# Patient Record
Sex: Female | Born: 1985 | Race: Black or African American | Hispanic: No | Marital: Single | Smoking: Never smoker
Health system: Southern US, Community
[De-identification: ages and names within clinical notes are randomized; demographics above are authoritative.]

## PROBLEM LIST (undated history)

## (undated) DIAGNOSIS — B9689 Other specified bacterial agents as the cause of diseases classified elsewhere: Secondary | ICD-10-CM

## (undated) DIAGNOSIS — E049 Nontoxic goiter, unspecified: Secondary | ICD-10-CM

## (undated) DIAGNOSIS — N390 Urinary tract infection, site not specified: Secondary | ICD-10-CM

## (undated) DIAGNOSIS — N760 Acute vaginitis: Secondary | ICD-10-CM

## (undated) HISTORY — PX: TONSILLECTOMY: SUR1361

## (undated) HISTORY — PX: WISDOM TOOTH EXTRACTION: SHX21

---

## 2009-10-27 ENCOUNTER — Emergency Department (HOSPITAL_BASED_OUTPATIENT_CLINIC_OR_DEPARTMENT_OTHER): Admission: EM | Admit: 2009-10-27 | Discharge: 2009-10-27 | Payer: Self-pay | Admitting: Emergency Medicine

## 2009-12-14 ENCOUNTER — Emergency Department (HOSPITAL_BASED_OUTPATIENT_CLINIC_OR_DEPARTMENT_OTHER): Admission: EM | Admit: 2009-12-14 | Discharge: 2009-12-14 | Payer: Self-pay | Admitting: Emergency Medicine

## 2010-03-20 ENCOUNTER — Emergency Department (HOSPITAL_BASED_OUTPATIENT_CLINIC_OR_DEPARTMENT_OTHER): Admission: EM | Admit: 2010-03-20 | Discharge: 2010-03-20 | Payer: Self-pay | Admitting: Emergency Medicine

## 2010-04-19 ENCOUNTER — Emergency Department (HOSPITAL_BASED_OUTPATIENT_CLINIC_OR_DEPARTMENT_OTHER): Admission: EM | Admit: 2010-04-19 | Discharge: 2010-04-19 | Payer: Self-pay | Admitting: Emergency Medicine

## 2010-10-19 LAB — URINE CULTURE
Colony Count: >=100000
Culture  Setup Time: 201109141736

## 2010-10-19 LAB — WET PREP, GENITAL
Clue Cells Wet Prep HPF POC: NONE SEEN
Trich, Wet Prep: NONE SEEN
WBC, Wet Prep HPF POC: NONE SEEN

## 2010-10-19 LAB — URINALYSIS, ROUTINE W REFLEX MICROSCOPIC
Ketones, ur: NEGATIVE mg/dL
Nitrite: NEGATIVE
Protein, ur: NEGATIVE mg/dL
Urobilinogen, UA: 0.2 mg/dL (ref 0.0–1.0)
pH: 5.5 (ref 5.0–8.0)

## 2010-10-20 LAB — URINALYSIS, ROUTINE W REFLEX MICROSCOPIC
Ketones, ur: NEGATIVE mg/dL
Protein, ur: NEGATIVE mg/dL
Urobilinogen, UA: 0.2 mg/dL (ref 0.0–1.0)

## 2010-10-20 LAB — URINE MICROSCOPIC-ADD ON

## 2010-10-30 LAB — URINALYSIS, ROUTINE W REFLEX MICROSCOPIC
Bilirubin Urine: NEGATIVE
Glucose, UA: NEGATIVE mg/dL
Ketones, ur: NEGATIVE mg/dL
Nitrite: NEGATIVE
Specific Gravity, Urine: 1.015 (ref 1.005–1.030)
pH: 6 (ref 5.0–8.0)

## 2010-10-30 LAB — URINE MICROSCOPIC-ADD ON

## 2010-10-30 LAB — WET PREP, GENITAL

## 2010-10-30 LAB — RPR: RPR Ser Ql: NONREACTIVE

## 2011-09-10 ENCOUNTER — Emergency Department (HOSPITAL_BASED_OUTPATIENT_CLINIC_OR_DEPARTMENT_OTHER)
Admission: EM | Admit: 2011-09-10 | Discharge: 2011-09-10 | Disposition: A | Discharge status: Home / Self Care | Payer: Self-pay | Attending: Emergency Medicine | Admitting: Emergency Medicine

## 2011-09-10 ENCOUNTER — Encounter (HOSPITAL_BASED_OUTPATIENT_CLINIC_OR_DEPARTMENT_OTHER): Payer: Self-pay

## 2011-09-10 DIAGNOSIS — N898 Other specified noninflammatory disorders of vagina: Secondary | ICD-10-CM | POA: Insufficient documentation

## 2011-09-10 DIAGNOSIS — N939 Abnormal uterine and vaginal bleeding, unspecified: Secondary | ICD-10-CM

## 2011-09-10 DIAGNOSIS — R110 Nausea: Secondary | ICD-10-CM | POA: Insufficient documentation

## 2011-09-10 LAB — URINALYSIS, MICROSCOPIC ONLY
Bilirubin Urine: NEGATIVE
Leukocytes, UA: NEGATIVE
Nitrite: NEGATIVE
Specific Gravity, Urine: 1.015 (ref 1.005–1.030)
Urobilinogen, UA: 0.2 mg/dL (ref 0.0–1.0)
WBC, UA: NONE SEEN WBC/hpf (ref <3)
pH: 6 (ref 5.0–8.0)

## 2011-09-10 LAB — WET PREP, GENITAL: Yeast Wet Prep HPF POC: NONE SEEN

## 2011-09-10 MED ORDER — ONDANSETRON 4 MG PO TBDP: 4.0000 mg | ORAL_TABLET | Freq: Three times a day (TID) | ORAL | Status: AC | PRN

## 2011-09-10 MED ORDER — ONDANSETRON 4 MG PO TBDP
4.0000 mg | ORAL_TABLET | Freq: Once | ORAL | Status: AC
  Administered 2011-09-10: 4 mg via ORAL
  Filled 2011-09-10: qty 1

## 2011-09-10 MED ORDER — ETHYNODIOL DIAC-ETH ESTRADIOL 1-35 MG-MCG PO TABS: 1.0000 | ORAL_TABLET | Freq: Every day | ORAL | Status: DC

## 2011-09-10 NOTE — ED Notes (Signed)
Pt reports nausea and vaginal bleeding since Saturday.  She states bleeding is heavy.

## 2011-09-10 NOTE — ED Provider Notes (Signed)
History     CSN: [id number]  Arrival date & time 09/10/11  1008   First MD Initiated Contact with Patient 09/10/11 1104      Chief Complaint  Patient presents with  . Vaginal Bleeding  . Nausea    (Consider location/radiation/quality/duration/timing/severity/associated sxs/prior treatment) HPI  H/o vaginal bleeding x 2 days. States heavy bleeding since this morning. Mild nausea. Has gone through 3 pads in past four hours. C/O diffuse lower abdominal pain and back pain. Was taking depo provera-- last injection October.   Has been spotting since October. Denies fever, chills. Sexually active, does not use protection. States she's unsure if she's pregnant. Denies hematuria/dysuria/freq/urgency. C/O white vaginal discharge for several weeks.  Pmhx: none Pshx: none  No family history on file.  History  Substance Use Topics  . Smoking status: Never Smoker   . Smokeless tobacco: Never Used  . Alcohol Use: No    OB History    Grav Para Term Preterm Abortions TAB SAB Ect Mult Living                  Review of Systems  All other systems reviewed and are negative.   except as noted HPI   Allergies  Review of patient's allergies indicates no known allergies.  Home Medications   Current Outpatient Rx  Name Route Sig Dispense Refill  . ETHYNODIOL DIAC-ETH ESTRADIOL 1-35 MG-MCG PO TABS Oral Take 1 tablet by mouth daily. 1 Package 11    Take 5 pills on day 1, four pills on day 2, three  ...  . ONDANSETRON 4 MG PO TBDP Oral Take 1 tablet (4 mg total) by mouth every 8 (eight) hours as needed for nausea. 20 tablet 0    BP 117/74  Pulse 68  Temp(Src) 97.8 F (36.6 C) (Oral)  Resp 18  Ht 5\' 2"  (1.575 m)  Wt 141 lb (63.957 kg)  BMI 25.79 kg/m2  SpO2 100%  LMP 09/10/2011  Physical Exam  Nursing note and vitals reviewed. Constitutional: She is oriented to person, place, and time. She appears well-developed.  HENT:  Head: Atraumatic.  Mouth/Throat: Oropharynx is clear  and moist.  Eyes: Conjunctivae and EOM are normal. Pupils are equal, round, and reactive to light.  Neck: Normal range of motion. Neck supple.  Cardiovascular: Normal rate, regular rhythm, normal heart sounds and intact distal pulses.   Pulmonary/Chest: Effort normal and breath sounds normal. No respiratory distress. She has no wheezes. She has no rales.  Abdominal: Soft. She exhibits no distension. There is no tenderness. There is no rebound and no guarding.       No CVAT  Genitourinary: No vaginal discharge found.       Blood actively oozing from cervical os pooling in vaginal vault-- moderate No CMT No R/L adnexal ttp  Musculoskeletal: Normal range of motion.  Neurological: She is alert and oriented to person, place, and time.  Skin: Skin is warm and dry. No rash noted.  Psychiatric: She has a normal mood and affect.    ED Course  Procedures (including critical care time)  Labs Reviewed  URINALYSIS, WITH MICROSCOPIC - Abnormal; Notable for the following:    Hgb urine dipstick MODERATE (*)    All other components within normal limits  WET PREP, GENITAL - Abnormal; Notable for the following:    Clue Cells Wet Prep HPF POC FEW (*)    WBC, Wet Prep HPF POC FEW (*)    All other components within normal limits  PREGNANCY, URINE  GC/CHLAMYDIA PROBE AMP, GENITAL   No results found.   1. Vaginal bleeding     MDM  She presents with moderate vaginal bleeding. I think this is likely secondary to her meds Depo-Provera. She does state that she will like to be started on oral contraceptive pills. She is asymptomatic at this time other than abdominal cramping she is hemodynamically stable. We'll start her on high dose oral estrogen to control bleeding as she does have some active bleeding on my exam. She is low risk for thrombosis. The patient should follow with her OB/GYN in the next 2 weeks who can start her on oral birth control pills with refills. Advised re: use of barrier  contraception. Ibuprofen prn cramping.        Forbes Cellar, MD 09/10/11 1239

## 2011-09-11 LAB — GC/CHLAMYDIA PROBE AMP, GENITAL
Chlamydia, DNA Probe: NEGATIVE
GC Probe Amp, Genital: NEGATIVE

## 2011-12-13 ENCOUNTER — Emergency Department (HOSPITAL_BASED_OUTPATIENT_CLINIC_OR_DEPARTMENT_OTHER)
Admission: EM | Admit: 2011-12-13 | Discharge: 2011-12-13 | Disposition: A | Discharge status: Home / Self Care | Payer: Self-pay | Attending: Emergency Medicine | Admitting: Emergency Medicine

## 2011-12-13 ENCOUNTER — Encounter (HOSPITAL_BASED_OUTPATIENT_CLINIC_OR_DEPARTMENT_OTHER): Payer: Self-pay | Admitting: *Deleted

## 2011-12-13 DIAGNOSIS — IMO0002 Reserved for concepts with insufficient information to code with codable children: Secondary | ICD-10-CM

## 2011-12-13 DIAGNOSIS — Z4802 Encounter for removal of sutures: Secondary | ICD-10-CM | POA: Insufficient documentation

## 2011-12-13 NOTE — ED Notes (Signed)
Suture removal to her right wrist. States her laceration was repaired at [facility] a week ago. Well healed. Sutures x 2 noted.

## 2011-12-13 NOTE — ED Provider Notes (Signed)
History     CSN: [id number]  Arrival date & time 12/13/11  1536   First MD Initiated Contact with Patient 12/13/11 1552      Chief Complaint  Patient presents with  . Suture / Staple Removal    (Consider location/radiation/quality/duration/timing/severity/associated sxs/prior treatment) Patient is a 26 y.o. female presenting with suture removal. The history is provided by the patient. No language interpreter was used.  Suture / Staple Removal  The sutures were placed 7 to 10 days ago. Treatments since wound repair include regular soap and water washings. Her temperature was unmeasured prior to arrival. There has been no drainage from the wound. There is no redness present. There is no swelling present. The pain has no pain.    History reviewed. No pertinent past medical history.  Past Surgical History  Procedure Date  . Tonsillectomy     No family history on file.  History  Substance Use Topics  . Smoking status: Never Smoker   . Smokeless tobacco: Never Used  . Alcohol Use: No    OB History    Grav Para Term Preterm Abortions TAB SAB Ect Mult Living                  Review of Systems  Constitutional: Negative.   Respiratory: Negative.   Cardiovascular: Negative.     Allergies  Review of patient's allergies indicates no known allergies.  Home Medications   Current Outpatient Rx  Name Route Sig Dispense Refill  . ETHYNODIOL DIAC-ETH ESTRADIOL 1-35 MG-MCG PO TABS Oral Take 1 tablet by mouth daily. 1 Package 11    Take 5 pills on day 1, four pills on day 2, three  ...    BP 123/88  Pulse 73  Temp(Src) 97.3 F (36.3 C) (Oral)  Resp 20  SpO2 100%  Physical Exam  Nursing note and vitals reviewed. Constitutional: She appears well-developed and well-nourished.  Cardiovascular: Normal rate and regular rhythm.   Pulmonary/Chest: Effort normal and breath sounds normal.  Skin:       Pt has a well healed wound to the right wrist    ED Course  SUTURE  REMOVAL Performed by: Teressa Lower Authorized by: Teressa Lower Consent: Verbal consent obtained. Risks and benefits: risks, benefits and alternatives were discussed Consent given by: patient Patient identity confirmed: verbally with patient Time out: Immediately prior to procedure a "time out" was called to verify the correct patient, procedure, equipment, support staff and site/side marked as required. Body area: upper extremity Location details: right wrist Wound Appearance: clean Sutures Removed: 2 Patient tolerance: Patient tolerated the procedure well with no immediate complications.   (including critical care time)  Labs Reviewed - No data to display No results found.   1. Dressing change/suture removal       MDM  Sutures placed at [facility]:no drainage noted:pt has stitches placed at [facility]:pt states that she is having problems with moving her thumb:will give pt hand follow up         Teressa Lower, NP 12/13/11 1615  Teressa Lower, NP 12/13/11 1622

## 2011-12-13 NOTE — Discharge Instructions (Signed)
Suture Removal You have had your sutures (stitches) removed today. This means your wound has healed well enough to take out your stitches. Be careful to protect the wound area over the next several weeks. An injury this area could cause the cut to split open again. It usually takes 1-2 years for a scar to get its full strength and loose its redness. For wounds that heal slowly, tapes may be applied to reinforce the skin for several days after the stitches are removed. You may allow the sutured area to get wet. Topical antibiotics (antibiotics you put on your skin) are not usually needed at this point. Applying vitamin E oil and aloe vera ointments may help the wound heal faster and stronger. Some scars form extra pigment with exposure to sunlight during the first 6-12 months after repair. This can be prevented by using a sun block (SPF 15-30) on the affected area. Call your doctor if you have any concerns about your injury. Call right away if you have any evidence of wound infection such as increased pain, drainage, redness, or swelling. Document Released: 08/30/2004 Document Revised: 07/12/2011 Document Reviewed: 05/14/2008 [employer] Patient Information 2012 [employer]. 

## 2011-12-13 NOTE — ED Provider Notes (Signed)
Medical screening examination/treatment/procedure(s) were performed by non-physician practitioner and as supervising physician I was immediately available for consultation/collaboration.  Kaleeya Hancock T Sherod Cisse, MD 12/13/11 2212 

## 2012-08-01 ENCOUNTER — Emergency Department (HOSPITAL_BASED_OUTPATIENT_CLINIC_OR_DEPARTMENT_OTHER)
Admission: EM | Admit: 2012-08-01 | Discharge: 2012-08-02 | Disposition: A | Discharge status: Home / Self Care | Attending: Emergency Medicine | Admitting: Emergency Medicine

## 2012-08-01 ENCOUNTER — Emergency Department (HOSPITAL_BASED_OUTPATIENT_CLINIC_OR_DEPARTMENT_OTHER)

## 2012-08-01 ENCOUNTER — Encounter (HOSPITAL_BASED_OUTPATIENT_CLINIC_OR_DEPARTMENT_OTHER): Payer: Self-pay

## 2012-08-01 DIAGNOSIS — Y929 Unspecified place or not applicable: Secondary | ICD-10-CM | POA: Insufficient documentation

## 2012-08-01 DIAGNOSIS — Z79899 Other long term (current) drug therapy: Secondary | ICD-10-CM | POA: Insufficient documentation

## 2012-08-01 DIAGNOSIS — S61209A Unspecified open wound of unspecified finger without damage to nail, initial encounter: Secondary | ICD-10-CM | POA: Insufficient documentation

## 2012-08-01 DIAGNOSIS — W230XXA Caught, crushed, jammed, or pinched between moving objects, initial encounter: Secondary | ICD-10-CM | POA: Insufficient documentation

## 2012-08-01 DIAGNOSIS — Y9383 Activity, rough housing and horseplay: Secondary | ICD-10-CM | POA: Insufficient documentation

## 2012-08-01 DIAGNOSIS — S6990XA Unspecified injury of unspecified wrist, hand and finger(s), initial encounter: Secondary | ICD-10-CM

## 2012-08-01 MED ORDER — LIDOCAINE HCL 2 % IJ SOLN
10.0000 mL | Freq: Once | INTRAMUSCULAR | Status: AC
  Administered 2012-08-01: 200 mg
  Filled 2012-08-01: qty 20

## 2012-08-01 MED ORDER — SODIUM CHLORIDE 0.9 % IV SOLN
Freq: Once | INTRAVENOUS | Status: AC
  Administered 2012-08-01: 23:00:00 via INTRAVENOUS

## 2012-08-01 MED ORDER — OXYCODONE-ACETAMINOPHEN 5-325 MG PO TABS: 2.0000 | ORAL_TABLET | ORAL | Status: DC | PRN

## 2012-08-01 MED ORDER — OXYCODONE-ACETAMINOPHEN 5-325 MG PO TABS
1.0000 | ORAL_TABLET | Freq: Once | ORAL | Status: AC
  Administered 2012-08-01: 1 via ORAL
  Filled 2012-08-01 (×2): qty 1

## 2012-08-01 MED ORDER — CEFAZOLIN SODIUM 1-5 GM-% IV SOLN
1.0000 g | Freq: Once | INTRAVENOUS | Status: AC
  Administered 2012-08-01: 1 g via INTRAVENOUS
  Filled 2012-08-01: qty 50

## 2012-08-01 MED ORDER — IBUPROFEN 800 MG PO TABS
800.0000 mg | ORAL_TABLET | Freq: Once | ORAL | Status: AC
  Administered 2012-08-01: 800 mg via ORAL
  Filled 2012-08-01: qty 1

## 2012-08-01 NOTE — ED Provider Notes (Signed)
History     CSN: [id number]  Arrival date & time 08/01/12  0865   First MD Initiated Contact with Patient 08/01/12 2034      Chief Complaint  Patient presents with  . Finger Injury    (Consider location/radiation/quality/duration/timing/severity/associated sxs/prior treatment) Patient is a 26 y.o. female presenting with skin laceration. The history is provided by the patient and a relative. No language interpreter was used.  Laceration  The incident occurred 3 to 5 hours ago. The laceration is located on the right hand. The laceration is 2 cm in size. The pain is at a severity of 6/10. The pain is moderate. The pain has been constant since onset. Possible foreign bodies include wood. Her tetanus status is UTD.   26 year old female with an injury to her third finger on the right hand. Her and her cousin were playing and he shut a door on her finger. There is a flap injury to the skin. We will get an x-ray. Her tetanus is up-to-date. Bleeding is controlled.   History reviewed. No pertinent past medical history.  Past Surgical History  Procedure Date  . Tonsillectomy     No family history on file.  History  Substance Use Topics  . Smoking status: Never Smoker   . Smokeless tobacco: Never Used  . Alcohol Use: No    OB History    Grav Para Term Preterm Abortions TAB SAB Ect Mult Living                  Review of Systems  Constitutional: Negative.   HENT: Negative.   Eyes: Negative.   Respiratory: Negative.   Cardiovascular: Negative.   Gastrointestinal: Negative.   Skin:       Laceration to 3rd finger on L hand.  Neurological: Negative.   Psychiatric/Behavioral: Negative.   All other systems reviewed and are negative.    Allergies  Review of patient's allergies indicates no known allergies.  Home Medications   Current Outpatient Rx  Name  Route  Sig  Dispense  Refill  . ETHYNODIOL DIAC-ETH ESTRADIOL 1-35 MG-MCG PO TABS   Oral   Take 1 tablet by mouth  daily.   1 Package   11     Take 5 pills on day 1, four pills on day 2, three  ...     BP 132/62  Pulse 78  Temp 98.7 F (37.1 C) (Oral)  Resp 16  Ht 5\' 2"  (1.575 m)  Wt 127 lb (57.607 kg)  BMI 23.23 kg/m2  SpO2 100%  LMP 07/27/2012  Physical Exam  Nursing note and vitals reviewed. Constitutional: She is oriented to person, place, and time. She appears well-developed and well-nourished.  HENT:  Head: Normocephalic and atraumatic.  Eyes: Conjunctivae normal and EOM are normal. Pupils are equal, round, and reactive to light.  Neck: Normal range of motion. Neck supple.  Cardiovascular: Normal rate.   Pulmonary/Chest: Effort normal.  Abdominal: Soft.  Musculoskeletal: Normal range of motion. She exhibits no edema and no tenderness.  Neurological: She is alert and oriented to person, place, and time. She has normal reflexes.  Skin: Skin is warm and dry.       Flap laceration to 3rd finger on L hand tenderness 8/10  Psychiatric: She has a normal mood and affect.    ED Course  NERVE BLOCK Date/Time: 08/01/2012 8:44 AM Performed by: Remi Haggard Authorized by: Remi Haggard Consent: Verbal consent obtained. Risks and benefits: risks, benefits and alternatives were discussed Consent  given by: patient Patient understanding: patient states understanding of the procedure being performed Patient identity confirmed: verbally with patient, arm band, provided demographic data and hospital-assigned identification number Time out: Immediately prior to procedure a "time out" was called to verify the correct patient, procedure, equipment, support staff and site/side marked as required. Indications: pain relief Body area: upper extremity (L 3rd finger) Laterality: left Patient sedated: no Preparation: Patient was prepped and draped in the usual sterile fashion. Anesthetic total: 4 ml Outcome: pain improved Patient tolerance: Patient tolerated the procedure well with no immediate  complications.  Irrigation Date/Time: 08/01/2012 8:46 AM Performed by: Remi Haggard Authorized by: Remi Haggard Consent: Verbal consent obtained. Risks and benefits: risks, benefits and alternatives were discussed Consent given by: patient Patient understanding: patient states understanding of the procedure being performed Patient identity confirmed: verbally with patient, arm band, provided demographic data and hospital-assigned identification number Time out: Immediately prior to procedure a "time out" was called to verify the correct patient, procedure, equipment, support staff and site/side marked as required. Comments: L 3rd finger irrigated with ns 80cc jet lavage with digital block   (including critical care time)  Labs Reviewed - No data to display Dg Finger Middle Right  08/01/2012  *RADIOLOGY REPORT*  Clinical Data: Finger injury  RIGHT MIDDLE FINGER 2+V  Comparison: None.  Findings: There may be a small nondisplaced fracture involving the tuft of the distal phalanx.  A subtle area of linear lucency is noted in this area. No displaced fractures or dislocations identified.  Soft tissue irregularity overlying the distal phalanx is noted.  IMPRESSION:  1.  Cannot rule out small nondisplaced fracture involving the tuft of the distal phalanx.   Original Report Authenticated By: Signa Kell, M.D.      No diagnosis found.    MDM  ? fx to tuft of distal phalanx open.  Irrigated flap wound left open.  Splint applied.  Ancef 1 gram IV in the ER.  Will follow up with hand on Monday.  Ibuprofen / percocet for pain.  She understands to return to ER for severe pain or purulent drainage.       Remi Haggard, NP 08/02/12 1050

## 2012-08-01 NOTE — ED Notes (Addendum)
closed right middle finger in door approx 30 min PTA-slight pooled blood to tip/nail-gauze & tape applied in triage

## 2012-08-01 NOTE — ED Notes (Signed)
NP at bedside.

## 2012-08-01 NOTE — Progress Notes (Signed)
Bacitracin applied to laceration per RN orders. Splint applied with no complications. Patient given instructions on care until she follows up.

## 2012-08-09 NOTE — ED Provider Notes (Signed)
Medical screening examination/treatment/procedure(s) were performed by non-physician practitioner and as supervising physician I was immediately available for consultation/collaboration.   Gwyneth Sprout, MD 08/09/12 1002

## 2013-01-02 ENCOUNTER — Emergency Department (HOSPITAL_BASED_OUTPATIENT_CLINIC_OR_DEPARTMENT_OTHER)
Admission: EM | Admit: 2013-01-02 | Discharge: 2013-01-02 | Disposition: A | Discharge status: Home / Self Care | Attending: Emergency Medicine | Admitting: Emergency Medicine

## 2013-01-02 ENCOUNTER — Encounter (HOSPITAL_BASED_OUTPATIENT_CLINIC_OR_DEPARTMENT_OTHER): Payer: Self-pay | Admitting: *Deleted

## 2013-01-02 DIAGNOSIS — B9689 Other specified bacterial agents as the cause of diseases classified elsewhere: Secondary | ICD-10-CM

## 2013-01-02 DIAGNOSIS — N760 Acute vaginitis: Secondary | ICD-10-CM

## 2013-01-02 DIAGNOSIS — Z3202 Encounter for pregnancy test, result negative: Secondary | ICD-10-CM | POA: Insufficient documentation

## 2013-01-02 LAB — URINALYSIS, ROUTINE W REFLEX MICROSCOPIC
Glucose, UA: NEGATIVE mg/dL
Hgb urine dipstick: NEGATIVE
Protein, ur: NEGATIVE mg/dL
Specific Gravity, Urine: 1.013 (ref 1.005–1.030)
Urobilinogen, UA: 1 mg/dL (ref 0.0–1.0)

## 2013-01-02 LAB — PREGNANCY, URINE: Preg Test, Ur: NEGATIVE

## 2013-01-02 LAB — URINE MICROSCOPIC-ADD ON

## 2013-01-02 MED ORDER — METRONIDAZOLE 500 MG PO TABS: 500.0000 mg | ORAL_TABLET | Freq: Two times a day (BID) | ORAL | Status: DC

## 2013-01-02 MED ORDER — FLUCONAZOLE 150 MG PO TABS: 150.0000 mg | ORAL_TABLET | Freq: Once | ORAL | Status: DC

## 2013-01-02 NOTE — ED Notes (Signed)
Pt amb to triage with quick steady gait in nad. Pt reports vaginal d/c x 3 days, states she looked at it under the microscope at work and thinks it is a yeast infection and or bv.

## 2013-01-02 NOTE — ED Provider Notes (Signed)
History     CSN: [id number]  Arrival date & time 01/02/13  1742   First MD Initiated Contact with Patient 01/02/13 1747      Chief Complaint  Patient presents with  . Vaginal Discharge    (Consider location/radiation/quality/duration/timing/severity/associated sxs/prior treatment) HPI Comments: 27 year old female sent to the emergency department complaining of vaginal discharge x3 days. Discharge is white, thick and has a "fishy odor". 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 Denies abdominal pain, nausea or vomiting. No vaginal bleeding. Sexually active with one partner, uses condoms for protection, states she had a condom stuck inside of her over a period of a few days, she was able to get it out 2 days ago. Denies urinary symptoms, fever, chills, vaginal pain. Last menstrual period began one week ago, ended a couple days ago.  Patient is a 27 y.o. female presenting with vaginal discharge. The history is provided by the patient.  Vaginal Discharge Associated symptoms: no abdominal pain, no dyspareunia, no dysuria, no fever, no nausea and no vomiting     History reviewed. No pertinent past medical history.  Past Surgical History  Procedure Laterality Date  . Tonsillectomy      History reviewed. No pertinent family history.  History  Substance Use Topics  . Smoking status: Never Smoker   . Smokeless tobacco: Never Used  . Alcohol Use: No    OB History   Grav Para Term Preterm Abortions TAB SAB Ect Mult Living                  Review of Systems  Constitutional: Negative for fever and chills.  Gastrointestinal: Negative for nausea, vomiting and abdominal pain.  Genitourinary: Positive for vaginal discharge. Negative for dysuria, urgency, frequency, flank pain, vaginal bleeding, difficulty urinating, menstrual problem, pelvic pain and dyspareunia.  All other  systems reviewed and are negative.    Allergies  Review of patient's allergies indicates no known allergies.  Home Medications   Current Outpatient Rx  Name  Route  Sig  Dispense  Refill  . EXPIRED: ethynodiol-ethinyl estradiol (KELNOR,ZOVIA) 1-35 MG-MCG tablet   Oral   Take 1 tablet by mouth daily.   1 Package   11     Take 5 pills on day 1, four pills on day 2, three  ...   . oxyCODONE-acetaminophen (PERCOCET/ROXICET) 5-325 MG per tablet   Oral   Take 2 tablets by mouth every 4 (four) hours as needed for pain.   15 tablet   0     BP 121/83  Pulse 76  Temp(Src) 98.9 F (37.2 C) (Oral)  Resp 20  SpO2 99%  LMP 12/26/2012  Physical Exam  Nursing note and vitals reviewed. Constitutional: She is oriented to person, place, and time. She appears well-developed and well-nourished. No distress.  HENT:  Head: Normocephalic and atraumatic.  Mouth/Throat: Oropharynx is clear and moist.  Eyes: Conjunctivae are normal.  Neck: Normal range of motion. Neck supple.  Cardiovascular: Normal rate, regular rhythm and normal heart sounds.   Pulmonary/Chest: Effort normal and breath sounds normal.  Abdominal: Soft. Bowel sounds are normal. She exhibits no distension. There is no tenderness.  Genitourinary: Uterus normal. There is no rash or tenderness on the right labia. There is no rash or tenderness on the left labia. Cervix exhibits no motion tenderness, no discharge and no friability. Right adnexum displays no mass,  no tenderness and no fullness. Left adnexum displays no mass, no tenderness and no fullness. No erythema, tenderness or bleeding around the vagina. Vaginal discharge (thick, white, malodorous) found.  Musculoskeletal: Normal range of motion. She exhibits no edema.  Neurological: She is alert and oriented to person, place, and time.  Skin: Skin is warm and dry. She is not diaphoretic.  Psychiatric: She has a normal mood and affect. Her behavior is normal.    ED Course   Procedures (including critical care time)  Labs Reviewed  WET PREP, GENITAL - Abnormal; Notable for the following:    Clue Cells Wet Prep HPF POC MODERATE (*)    WBC, Wet Prep HPF POC FEW (*)    All other components within normal limits  URINALYSIS, ROUTINE W REFLEX MICROSCOPIC - Abnormal; Notable for the following:    Leukocytes, UA TRACE (*)    All other components within normal limits  GC/CHLAMYDIA PROBE AMP  PREGNANCY, URINE  URINE MICROSCOPIC-ADD ON  RPR   No results found.   1. Bacterial vaginosis       MDM  27 year old female with BV. Pelvic exam with white vaginal discharge, otherwise unremarkable. No CMT or adnexal tenderness. She is in no apparent distress with normal vital signs. Will prescribe Flagyl. States she always develops yeast infection with antibiotics, will give her prescription for one Diflucan. Return precautions discussed. Patient states understanding of plan and is agreeable.      Trevor Mace, PA-C 01/02/13 1912

## 2013-01-02 NOTE — ED Provider Notes (Signed)
Medical screening examination/treatment/procedure(s) were performed by non-physician practitioner and as supervising physician I was immediately available for consultation/collaboration.  Geoffery Lyons, MD 01/02/13 2248

## 2013-01-03 LAB — GC/CHLAMYDIA PROBE AMP: CT Probe RNA: NEGATIVE

## 2013-01-27 ENCOUNTER — Encounter (HOSPITAL_BASED_OUTPATIENT_CLINIC_OR_DEPARTMENT_OTHER): Payer: Self-pay | Admitting: *Deleted

## 2013-01-27 ENCOUNTER — Emergency Department (HOSPITAL_BASED_OUTPATIENT_CLINIC_OR_DEPARTMENT_OTHER)
Admission: EM | Admit: 2013-01-27 | Discharge: 2013-01-27 | Disposition: A | Discharge status: Home / Self Care | Attending: Emergency Medicine | Admitting: Emergency Medicine

## 2013-01-27 DIAGNOSIS — Z3202 Encounter for pregnancy test, result negative: Secondary | ICD-10-CM | POA: Insufficient documentation

## 2013-01-27 DIAGNOSIS — N760 Acute vaginitis: Secondary | ICD-10-CM | POA: Insufficient documentation

## 2013-01-27 DIAGNOSIS — A499 Bacterial infection, unspecified: Secondary | ICD-10-CM | POA: Insufficient documentation

## 2013-01-27 DIAGNOSIS — B9689 Other specified bacterial agents as the cause of diseases classified elsewhere: Secondary | ICD-10-CM

## 2013-01-27 LAB — URINALYSIS, ROUTINE W REFLEX MICROSCOPIC
Ketones, ur: NEGATIVE mg/dL
Leukocytes, UA: NEGATIVE
Nitrite: NEGATIVE
Protein, ur: NEGATIVE mg/dL
Urobilinogen, UA: 0.2 mg/dL (ref 0.0–1.0)

## 2013-01-27 LAB — PREGNANCY, URINE: Preg Test, Ur: NEGATIVE

## 2013-01-27 MED ORDER — METRONIDAZOLE 0.75 % VA GEL: 1.0000 | Freq: Two times a day (BID) | VAGINAL | Status: DC

## 2013-01-27 NOTE — ED Provider Notes (Signed)
Medical screening examination/treatment/procedure(s) were performed by non-physician practitioner and as supervising physician I was immediately available for consultation/collaboration.   Joash Tony, MD 01/27/13 2322 

## 2013-01-27 NOTE — ED Provider Notes (Signed)
   History    CSN: [id number] Arrival date & time 01/27/13  1741  First MD Initiated Contact with Patient 01/27/13 1759     Chief Complaint  Patient presents with  . Vaginal Discharge   (Consider location/radiation/quality/duration/timing/severity/associated sxs/prior Treatment) HPI Comments: Pt reports she was here 5/30 with BV.   Pt reports she has had multiple times.   Pt was given rx for flagyl pills.   Pt here requesting rx for metrogel vaginal.  Pt reports symptoms have not changed.  No std risk, not pregnant.    Patient is a 27 y.o. female presenting with vaginal discharge. The history is provided by the patient. No language interpreter was used.  Vaginal Discharge  History reviewed. No pertinent past medical history. Past Surgical History  Procedure Laterality Date  . Tonsillectomy     No family history on file. History  Substance Use Topics  . Smoking status: Never Smoker   . Smokeless tobacco: Never Used  . Alcohol Use: No   OB History   Grav Para Term Preterm Abortions TAB SAB Ect Mult Living                 Review of Systems  Genitourinary: Positive for vaginal discharge.  All other systems reviewed and are negative.    Allergies  Review of patient's allergies indicates no known allergies.  Home Medications   Current Outpatient Rx  Name  Route  Sig  Dispense  Refill  . Norgestimate-Eth Estradiol (SPRINTEC 28 PO)   Oral   Take by mouth.         . EXPIRED: ethynodiol-ethinyl estradiol (KELNOR,ZOVIA) 1-35 MG-MCG tablet   Oral   Take 1 tablet by mouth daily.   1 Package   11     Take 5 pills on day 1, four pills on day 2, three  ...   . fluconazole (DIFLUCAN) 150 MG tablet   Oral   Take 1 tablet (150 mg total) by mouth once.   1 tablet   0   . metroNIDAZOLE (FLAGYL) 500 MG tablet   Oral   Take 1 tablet (500 mg total) by mouth 2 (two) times daily. One po bid x 7 days   14 tablet   0   . oxyCODONE-acetaminophen (PERCOCET/ROXICET) 5-325 MG  per tablet   Oral   Take 2 tablets by mouth every 4 (four) hours as needed for pain.   15 tablet   0    BP 101/69  Pulse 82  Temp(Src) 98 F (36.7 C) (Oral)  Resp 16  Ht 5\' 2"  (1.575 m)  Wt 127 lb (57.607 kg)  BMI 23.22 kg/m2  SpO2 100%  LMP 12/26/2012 Physical Exam  Nursing note and vitals reviewed. Constitutional: She appears well-developed and well-nourished.  HENT:  Head: Normocephalic and atraumatic.  Neck: Normal range of motion.  Cardiovascular: Normal rate.   Pulmonary/Chest: Effort normal.  Abdominal: Soft.  Musculoskeletal: Normal range of motion.  Neurological: She is alert.  Skin: Skin is warm.    ED Course  Procedures (including critical care time) Labs Reviewed  PREGNANCY, URINE  URINALYSIS, ROUTINE W REFLEX MICROSCOPIC   No results found. No diagnosis found.  MDM  metrogel vaginal  Elson Areas, PA-C 01/27/13 1859

## 2013-01-27 NOTE — Discharge Instructions (Signed)
Bacterial Vaginosis Bacterial vaginosis (BV) is a vaginal infection where the normal balance of bacteria in the vagina is disrupted. The normal balance is then replaced by an overgrowth of certain bacteria. There are several different kinds of bacteria that can cause BV. BV is the most common vaginal infection in women of childbearing age. CAUSES   The cause of BV is not fully understood. BV develops when there is an increase or imbalance of harmful bacteria.  Some activities or behaviors can upset the normal balance of bacteria in the vagina and put women at increased risk including:  Having a new sex partner or multiple sex partners.  Douching.  Using an intrauterine device (IUD) for contraception.  It is not clear what role sexual activity plays in the development of BV. However, women that have never had sexual intercourse are rarely infected with BV. Women do not get BV from toilet seats, bedding, swimming pools or from touching objects around them.  SYMPTOMS   Grey vaginal discharge.  A fish-like odor with discharge, especially after sexual intercourse.  Itching or burning of the vagina and vulva.  Burning or pain with urination.  Some women have no signs or symptoms at all. DIAGNOSIS  Your caregiver must examine the vagina for signs of BV. Your caregiver will perform lab tests and look at the sample of vaginal fluid through a microscope. They will look for bacteria and abnormal cells (clue cells), a pH test higher than 4.5, and a positive amine test all associated with BV.  RISKS AND COMPLICATIONS   Pelvic inflammatory disease (PID).  Infections following gynecology surgery.  Developing HIV.  Developing herpes virus. TREATMENT  Sometimes BV will clear up without treatment. However, all women with symptoms of BV should be treated to avoid complications, especially if gynecology surgery is planned. Female partners generally do not need to be treated. However, BV may spread  between female sex partners so treatment is helpful in preventing a recurrence of BV.   BV may be treated with antibiotics. The antibiotics come in either pill or vaginal cream forms. Either can be used with nonpregnant or pregnant women, but the recommended dosages differ. These antibiotics are not harmful to the baby.  BV can recur after treatment. If this happens, a second round of antibiotics will often be prescribed.  Treatment is important for pregnant women. If not treated, BV can cause a premature delivery, especially for a pregnant woman who had a premature birth in the past. All pregnant women who have symptoms of BV should be checked and treated.  For chronic reoccurrence of BV, treatment with a type of prescribed gel vaginally twice a week is helpful. HOME CARE INSTRUCTIONS   Finish all medication as directed by your caregiver.  Do not have sex until treatment is completed.  Tell your sexual partner that you have a vaginal infection. They should see their caregiver and be treated if they have problems, such as a mild rash or itching.  Practice safe sex. Use condoms. Only have 1 sex partner. PREVENTION  Basic prevention steps can help reduce the risk of upsetting the natural balance of bacteria in the vagina and developing BV:  Do not have sexual intercourse (be abstinent).  Do not douche.  Use all of the medicine prescribed for treatment of BV, even if the signs and symptoms go away.  Tell your sex partner if you have BV. That way, they can be treated, if needed, to prevent reoccurrence. SEEK MEDICAL CARE IF:     Your symptoms are not improving after 3 days of treatment.  You have increased discharge, pain, or fever. MAKE SURE YOU:   Understand these instructions.  Will watch your condition.  Will get help right away if you are not doing well or get worse. FOR MORE INFORMATION  Division of STD Prevention (DSTDP), Centers for Disease Control and Prevention:  www.cdc.gov/std American Social Health Association (ASHA): www.ashastd.org  Document Released: 07/23/2005 Document Revised: 10/15/2011 Document Reviewed: 01/13/2009 Exit[employer] Patient Information 2014 [employer].  

## 2013-01-27 NOTE — ED Notes (Signed)
Dx wit bacterial vaginosis several weeks ago - has taken po flagyl without relief- reports gel is usually more effective for her

## 2013-02-07 ENCOUNTER — Encounter (HOSPITAL_BASED_OUTPATIENT_CLINIC_OR_DEPARTMENT_OTHER): Payer: Self-pay | Admitting: *Deleted

## 2013-02-07 ENCOUNTER — Emergency Department (HOSPITAL_BASED_OUTPATIENT_CLINIC_OR_DEPARTMENT_OTHER)
Admission: EM | Admit: 2013-02-07 | Discharge: 2013-02-07 | Disposition: A | Discharge status: Home / Self Care | Payer: Self-pay | Attending: Emergency Medicine | Admitting: Emergency Medicine

## 2013-02-07 DIAGNOSIS — K047 Periapical abscess without sinus: Secondary | ICD-10-CM

## 2013-02-07 DIAGNOSIS — K1370 Unspecified lesions of oral mucosa: Secondary | ICD-10-CM | POA: Insufficient documentation

## 2013-02-07 DIAGNOSIS — J029 Acute pharyngitis, unspecified: Secondary | ICD-10-CM | POA: Insufficient documentation

## 2013-02-07 DIAGNOSIS — K044 Acute apical periodontitis of pulpal origin: Secondary | ICD-10-CM | POA: Insufficient documentation

## 2013-02-07 DIAGNOSIS — Z9889 Other specified postprocedural states: Secondary | ICD-10-CM | POA: Insufficient documentation

## 2013-02-07 MED ORDER — PENICILLIN V POTASSIUM 250 MG PO TABS: 250.0000 mg | ORAL_TABLET | Freq: Four times a day (QID) | ORAL | Status: AC

## 2013-02-07 MED ORDER — OXYCODONE-ACETAMINOPHEN 5-325 MG PO TABS: 1.0000 | ORAL_TABLET | Freq: Four times a day (QID) | ORAL | Status: DC | PRN

## 2013-02-07 NOTE — ED Provider Notes (Signed)
History    CSN: [id number] Arrival date & time 02/07/13  1131  First MD Initiated Contact with Patient 02/07/13 1141     Chief Complaint  Patient presents with  . Otalgia   (Consider location/radiation/quality/duration/timing/severity/associated sxs/prior Treatment) HPI Comments: Patient states for the last one week she developed initially right sided mouth pain which then radiated to the ear with persistent ear pain since.  She denies fever, cough or nasal congestion. No difficulty swallowing. She states she did have a root canal done approximately one month ago but has noticed that her right lower back tooth has been painful as well. She denies any drainage from the ear.  No prior history of similar symptoms  The history is provided by the patient.   History reviewed. No pertinent past medical history. Past Surgical History  Procedure Laterality Date  . Tonsillectomy     No family history on file. History  Substance Use Topics  . Smoking status: Never Smoker   . Smokeless tobacco: Never Used  . Alcohol Use: No   OB History   Grav Para Term Preterm Abortions TAB SAB Ect Mult Living                 Review of Systems  Constitutional: Negative for fever.  HENT: Positive for ear pain, sore throat and mouth sores. Negative for facial swelling, trouble swallowing, neck pain, neck stiffness, voice change and ear discharge.   All other systems reviewed and are negative.    Allergies  Review of patient's allergies indicates no known allergies.  Home Medications   Current Outpatient Rx  Name  Route  Sig  Dispense  Refill  . EXPIRED: ethynodiol-ethinyl estradiol (KELNOR,ZOVIA) 1-35 MG-MCG tablet   Oral   Take 1 tablet by mouth daily.   1 Package   11     Take 5 pills on day 1, four pills on day 2, three  ...   . fluconazole (DIFLUCAN) 150 MG tablet   Oral   Take 1 tablet (150 mg total) by mouth once.   1 tablet   0   . metroNIDAZOLE (FLAGYL) 500 MG tablet   Oral   Take 1 tablet (500 mg total) by mouth 2 (two) times daily. One po bid x 7 days   14 tablet   0   . metroNIDAZOLE (METROGEL VAGINAL) 0.75 % vaginal gel   Vaginal   Place 1 Applicatorful vaginally 2 (two) times daily.   70 g   1   . Norgestimate-Eth Estradiol (SPRINTEC 28 PO)   Oral   Take by mouth.         . oxyCODONE-acetaminophen (PERCOCET/ROXICET) 5-325 MG per tablet   Oral   Take 2 tablets by mouth every 4 (four) hours as needed for pain.   15 tablet   0   . oxyCODONE-acetaminophen (PERCOCET/ROXICET) 5-325 MG per tablet   Oral   Take 1 tablet by mouth every 6 (six) hours as needed for pain.   15 tablet   0   . penicillin v potassium (VEETID) 250 MG tablet   Oral   Take 1 tablet (250 mg total) by mouth 4 (four) times daily.   40 tablet   0    BP 113/70  Pulse 79  Temp(Src) 98.4 F (36.9 C) (Oral)  Resp 16  Ht 5\' 2"  (1.575 m)  Wt 128 lb (58.06 kg)  BMI 23.41 kg/m2  SpO2 99% Physical Exam  Nursing note and vitals reviewed. Constitutional: She is  oriented to person, place, and time. She appears well-developed and well-nourished. No distress.  HENT:  Head: Normocephalic and atraumatic.  Right Ear: Tympanic membrane and ear canal normal.  Left Ear: Tympanic membrane and ear canal normal.  Mouth/Throat: Oral lesions present.    Eyes: EOM are normal. Pupils are equal, round, and reactive to light.  Cardiovascular: Normal rate.   Pulmonary/Chest: Effort normal.  Lymphadenopathy:    She has cervical adenopathy.  Neurological: She is alert and oriented to person, place, and time.  Skin: Skin is warm and dry. No rash noted. No erythema.  Psychiatric: She has a normal mood and affect. Her behavior is normal.    ED Course  Procedures (including critical care time) Labs Reviewed - No data to display No results found. 1. Lesion of oral mucosa   2. Dental infection     MDM   Patient initially presented for ear pain but upon further evaluation found to have  an oral lesion in the right posterior lower jaw near the ear which is causing referred pain in the cause of her true symptoms. She had a root canal done approximately one month ago to a different tooth but the lower back tooth is also painful and mildly decay. Feel this lesion could be infectious from decayed to versus other. No sign of otitis media on exam. Patient treated with penicillin and pain control. She will follow up with her dentist.  Gwyneth Sprout, MD 02/07/13 [phone]

## 2013-02-07 NOTE — ED Notes (Signed)
Patient states that her right ear has been hurting for a week

## 2013-02-28 ENCOUNTER — Encounter (HOSPITAL_BASED_OUTPATIENT_CLINIC_OR_DEPARTMENT_OTHER): Payer: Self-pay | Admitting: Emergency Medicine

## 2013-02-28 ENCOUNTER — Emergency Department (HOSPITAL_BASED_OUTPATIENT_CLINIC_OR_DEPARTMENT_OTHER): Payer: No Typology Code available for payment source

## 2013-02-28 ENCOUNTER — Emergency Department (HOSPITAL_BASED_OUTPATIENT_CLINIC_OR_DEPARTMENT_OTHER)
Admission: EM | Admit: 2013-02-28 | Discharge: 2013-02-28 | Disposition: A | Discharge status: Home / Self Care | Payer: No Typology Code available for payment source | Attending: Emergency Medicine | Admitting: Emergency Medicine

## 2013-02-28 DIAGNOSIS — S46909A Unspecified injury of unspecified muscle, fascia and tendon at shoulder and upper arm level, unspecified arm, initial encounter: Secondary | ICD-10-CM | POA: Insufficient documentation

## 2013-02-28 DIAGNOSIS — Z79899 Other long term (current) drug therapy: Secondary | ICD-10-CM | POA: Insufficient documentation

## 2013-02-28 DIAGNOSIS — Z862 Personal history of diseases of the blood and blood-forming organs and certain disorders involving the immune mechanism: Secondary | ICD-10-CM | POA: Insufficient documentation

## 2013-02-28 DIAGNOSIS — Z8639 Personal history of other endocrine, nutritional and metabolic disease: Secondary | ICD-10-CM | POA: Insufficient documentation

## 2013-02-28 DIAGNOSIS — S59919A Unspecified injury of unspecified forearm, initial encounter: Secondary | ICD-10-CM | POA: Insufficient documentation

## 2013-02-28 DIAGNOSIS — Y9389 Activity, other specified: Secondary | ICD-10-CM | POA: Insufficient documentation

## 2013-02-28 DIAGNOSIS — S6990XA Unspecified injury of unspecified wrist, hand and finger(s), initial encounter: Secondary | ICD-10-CM | POA: Insufficient documentation

## 2013-02-28 DIAGNOSIS — Y92410 Unspecified street and highway as the place of occurrence of the external cause: Secondary | ICD-10-CM | POA: Insufficient documentation

## 2013-02-28 DIAGNOSIS — S59909A Unspecified injury of unspecified elbow, initial encounter: Secondary | ICD-10-CM | POA: Insufficient documentation

## 2013-02-28 DIAGNOSIS — S4980XA Other specified injuries of shoulder and upper arm, unspecified arm, initial encounter: Secondary | ICD-10-CM | POA: Insufficient documentation

## 2013-02-28 HISTORY — DX: Nontoxic goiter, unspecified: E04.9

## 2013-02-28 MED ORDER — IBUPROFEN 800 MG PO TABS
800.0000 mg | ORAL_TABLET | Freq: Once | ORAL | Status: AC
  Administered 2013-02-28: 800 mg via ORAL
  Filled 2013-02-28: qty 1

## 2013-02-28 MED ORDER — METHOCARBAMOL 500 MG PO TABS: 500.0000 mg | ORAL_TABLET | Freq: Two times a day (BID) | ORAL | Status: DC

## 2013-02-28 MED ORDER — IBUPROFEN 800 MG PO TABS: 800.0000 mg | ORAL_TABLET | Freq: Three times a day (TID) | ORAL | Status: DC
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
## 2013-03-01 NOTE — ED Provider Notes (Signed)
Medical screening examination/treatment/procedure(s) were performed by non-physician practitioner and as supervising physician I was immediately available for consultation/collaboration.   Charles B. Sheldon, MD 03/01/13 1709 

## 2013-05-20 ENCOUNTER — Emergency Department (HOSPITAL_BASED_OUTPATIENT_CLINIC_OR_DEPARTMENT_OTHER)
Admission: EM | Admit: 2013-05-20 | Discharge: 2013-05-20 | Disposition: A | Discharge status: Home / Self Care | Payer: 59 | Attending: Emergency Medicine | Admitting: Emergency Medicine

## 2013-05-20 ENCOUNTER — Encounter (HOSPITAL_BASED_OUTPATIENT_CLINIC_OR_DEPARTMENT_OTHER): Payer: Self-pay | Admitting: Emergency Medicine

## 2013-05-20 DIAGNOSIS — Z3202 Encounter for pregnancy test, result negative: Secondary | ICD-10-CM | POA: Insufficient documentation

## 2013-05-20 DIAGNOSIS — Z862 Personal history of diseases of the blood and blood-forming organs and certain disorders involving the immune mechanism: Secondary | ICD-10-CM | POA: Insufficient documentation

## 2013-05-20 DIAGNOSIS — N390 Urinary tract infection, site not specified: Secondary | ICD-10-CM

## 2013-05-20 DIAGNOSIS — Z8639 Personal history of other endocrine, nutritional and metabolic disease: Secondary | ICD-10-CM | POA: Insufficient documentation

## 2013-05-20 DIAGNOSIS — Z79899 Other long term (current) drug therapy: Secondary | ICD-10-CM | POA: Insufficient documentation

## 2013-05-20 LAB — URINE MICROSCOPIC-ADD ON

## 2013-05-20 LAB — URINALYSIS, ROUTINE W REFLEX MICROSCOPIC
Bilirubin Urine: NEGATIVE
Ketones, ur: NEGATIVE mg/dL
Nitrite: NEGATIVE
Protein, ur: NEGATIVE mg/dL
Urobilinogen, UA: 0.2 mg/dL (ref 0.0–1.0)

## 2013-05-20 MED ORDER — CEPHALEXIN 500 MG PO CAPS: 500.0000 mg | ORAL_CAPSULE | Freq: Four times a day (QID) | ORAL | Status: DC

## 2013-05-20 NOTE — ED Provider Notes (Signed)
CSN: [id number]     Arrival date & time 05/20/13  1507 History   First MD Initiated Contact with Patient 05/20/13 1519     Chief Complaint  Patient presents with  . Urinary Tract Infection   (Consider location/radiation/quality/duration/timing/severity/associated sxs/prior Treatment) HPI Comments: Patient is a 27 year old female who presents with a 3 week history of left flank pain. The pain is located in her left flank and does not radiate. The pain is described as aching and moderate. The pain started gradually and progressively worsened since the onset. No alleviating/aggravating factors. The patient has tried nothing for symptoms without relief. Associated symptoms include dysuria and urinary urgency. Patient denies fever, headache, NVD, chest pain, SOB, constipation, abnormal vaginal bleeding/discharge.     Patient is a 27 y.o. female presenting with urinary tract infection.  Urinary Tract Infection    Past Medical History  Diagnosis Date  . Goiter    Past Surgical History  Procedure Laterality Date  . Tonsillectomy     History reviewed. No pertinent family history. History  Substance Use Topics  . Smoking status: Never Smoker   . Smokeless tobacco: Never Used  . Alcohol Use: No   OB History   Grav Para Term Preterm Abortions TAB SAB Ect Mult Living                 Review of Systems  Genitourinary: Positive for dysuria and urgency.  All other systems reviewed and are negative.    Allergies  Review of patient's allergies indicates no known allergies.  Home Medications   Current Outpatient Rx  Name  Route  Sig  Dispense  Refill  . EXPIRED: ethynodiol-ethinyl estradiol (KELNOR,ZOVIA) 1-35 MG-MCG tablet   Oral   Take 1 tablet by mouth daily.   1 Package   11     Take 5 pills on day 1, four pills on day 2, three  ...   . Norgestimate-Eth Estradiol (SPRINTEC 28 PO)   Oral   Take by mouth.          BP 114/65  Pulse 66  Temp(Src) 98.6 F (37 C) (Oral)   Resp 18  Ht 5\' 2"  (1.575 m)  Wt 125 lb (56.7 kg)  BMI 22.86 kg/m2  SpO2 100% Physical Exam  Nursing note and vitals reviewed. Constitutional: She is oriented to person, place, and time. She appears well-developed and well-nourished. No distress.  HENT:  Head: Normocephalic and atraumatic.  Eyes: Conjunctivae and EOM are normal.  Neck: Normal range of motion.  Cardiovascular: Normal rate and regular rhythm.  Exam reveals no gallop and no friction rub.   No murmur heard. Pulmonary/Chest: Effort normal and breath sounds normal. She has no wheezes. She has no rales. She exhibits no tenderness.  Abdominal: Soft. She exhibits no distension. There is no tenderness. There is no rebound and no guarding.  Genitourinary:  No CVA tenderness.   Musculoskeletal: Normal range of motion.  Neurological: She is alert and oriented to person, place, and time. Coordination normal.  Speech is goal-oriented. Moves limbs without ataxia.   Skin: Skin is warm and dry.  Psychiatric: She has a normal mood and affect. Her behavior is normal.    ED Course  Procedures (including critical care time) Labs Review Labs Reviewed  URINALYSIS, ROUTINE W REFLEX MICROSCOPIC - Abnormal; Notable for the following:    APPearance CLOUDY (*)    Leukocytes, UA MODERATE (*)    All other components within normal limits  URINE MICROSCOPIC-ADD ON -  Abnormal; Notable for the following:    Bacteria, UA MANY (*)    All other components within normal limits  URINE CULTURE  PREGNANCY, URINE   Imaging Review No results found.  EKG Interpretation   None       MDM   1. UTI (urinary tract infection)     3:49 PM Urinalysis shows UTI. Patient will have Keflex. Vitals stable and patient afebrile. No further evaluation needed at this time. Patient instructed to return with worsening or concerning symptoms.    Emilia Beck, PA-C 05/20/13 1556

## 2013-05-20 NOTE — ED Provider Notes (Signed)
Medical screening examination/treatment/procedure(s) were performed by non-physician practitioner and as supervising physician I was immediately available for consultation/collaboration.   Kaedyn Belardo B. Foy Vanduyne, MD 05/20/13 2230 

## 2013-05-20 NOTE — ED Notes (Signed)
Pt reports (L) sided flank pain, dysuria, urgency x 3 weeks.

## 2013-05-22 LAB — URINE CULTURE: Colony Count: >=100000

## 2013-05-24 ENCOUNTER — Telehealth (HOSPITAL_COMMUNITY): Payer: Self-pay | Admitting: Emergency Medicine

## 2013-05-24 NOTE — ED Notes (Signed)
Post ED Visit - Positive Culture Follow-up  Culture report reviewed by antimicrobial stewardship pharmacist: []  Wes Dulaney, Pharm.D., BCPS []  Celedonio Miyamoto, Pharm.D., BCPS []  Georgina Pillion, Pharm.D., BCPS []  [name], [address].D., BCPS, AAHIVP []  Estella Husk, Pharm.D., BCPS, AAHIVP [x]  [name], [address].D., BCPS  Positive urine culture Treated with Keflex, organism sensitive to the same and no further patient follow-up is required at this time.  Janice Graham 05/24/2013, 9:42 AM

## 2013-08-24 ENCOUNTER — Emergency Department (HOSPITAL_BASED_OUTPATIENT_CLINIC_OR_DEPARTMENT_OTHER)
Admission: EM | Admit: 2013-08-24 | Discharge: 2013-08-24 | Disposition: A | Discharge status: Home / Self Care | Payer: 59 | Attending: Emergency Medicine | Admitting: Emergency Medicine

## 2013-08-24 ENCOUNTER — Encounter (HOSPITAL_BASED_OUTPATIENT_CLINIC_OR_DEPARTMENT_OTHER): Payer: Self-pay | Admitting: Emergency Medicine

## 2013-08-24 DIAGNOSIS — B9689 Other specified bacterial agents as the cause of diseases classified elsewhere: Secondary | ICD-10-CM | POA: Insufficient documentation

## 2013-08-24 DIAGNOSIS — Z862 Personal history of diseases of the blood and blood-forming organs and certain disorders involving the immune mechanism: Secondary | ICD-10-CM | POA: Insufficient documentation

## 2013-08-24 DIAGNOSIS — N760 Acute vaginitis: Secondary | ICD-10-CM | POA: Insufficient documentation

## 2013-08-24 DIAGNOSIS — N390 Urinary tract infection, site not specified: Secondary | ICD-10-CM

## 2013-08-24 DIAGNOSIS — Z8639 Personal history of other endocrine, nutritional and metabolic disease: Secondary | ICD-10-CM | POA: Insufficient documentation

## 2013-08-24 DIAGNOSIS — B373 Candidiasis of vulva and vagina: Secondary | ICD-10-CM | POA: Insufficient documentation

## 2013-08-24 DIAGNOSIS — B3731 Acute candidiasis of vulva and vagina: Secondary | ICD-10-CM | POA: Insufficient documentation

## 2013-08-24 DIAGNOSIS — Z3202 Encounter for pregnancy test, result negative: Secondary | ICD-10-CM | POA: Insufficient documentation

## 2013-08-24 DIAGNOSIS — A499 Bacterial infection, unspecified: Secondary | ICD-10-CM | POA: Insufficient documentation

## 2013-08-24 DIAGNOSIS — Z79899 Other long term (current) drug therapy: Secondary | ICD-10-CM | POA: Insufficient documentation

## 2013-08-24 LAB — URINALYSIS, ROUTINE W REFLEX MICROSCOPIC
Bilirubin Urine: NEGATIVE
Glucose, UA: NEGATIVE mg/dL
Hgb urine dipstick: NEGATIVE
Ketones, ur: NEGATIVE mg/dL
Nitrite: POSITIVE — AB
PH: 5.5 (ref 5.0–8.0)
Protein, ur: 30 mg/dL — AB
SPECIFIC GRAVITY, URINE: 1.017 (ref 1.005–1.030)
Urobilinogen, UA: 0.2 mg/dL (ref 0.0–1.0)

## 2013-08-24 LAB — WET PREP, GENITAL
TRICH WET PREP: NONE SEEN
Yeast Wet Prep HPF POC: NONE SEEN

## 2013-08-24 LAB — URINE MICROSCOPIC-ADD ON

## 2013-08-24 LAB — PREGNANCY, URINE: Preg Test, Ur: NEGATIVE

## 2013-08-24 MED ORDER — METRONIDAZOLE 500 MG PO TABS: 500.0000 mg | ORAL_TABLET | Freq: Two times a day (BID) | ORAL | Status: DC

## 2013-08-24 MED ORDER — CEPHALEXIN 500 MG PO CAPS: 500.0000 mg | ORAL_CAPSULE | Freq: Three times a day (TID) | ORAL | Status: DC

## 2013-08-24 MED ORDER — FLUCONAZOLE 150 MG PO TABS: 150.0000 mg | ORAL_TABLET | Freq: Once | ORAL | Status: AC

## 2013-08-24 MED ORDER — METRONIDAZOLE 0.75 % VA GEL: 1.0000 | Freq: Two times a day (BID) | VAGINAL | Status: DC

## 2013-08-24 MED ORDER — FLUCONAZOLE 100 MG PO TABS
150.0000 mg | ORAL_TABLET | Freq: Once | ORAL | Status: AC
  Administered 2013-08-24: 150 mg via ORAL
  Filled 2013-08-24 (×2): qty 1

## 2013-08-24 MED ORDER — CEPHALEXIN 250 MG PO CAPS
500.0000 mg | ORAL_CAPSULE | Freq: Once | ORAL | Status: AC
  Administered 2013-08-24: 500 mg via ORAL
  Filled 2013-08-24: qty 2

## 2013-08-24 NOTE — ED Notes (Signed)
In to discharge pt and while giving instructions, pt states she cannot take Flagyl pills requesting vaginal cream insert - notified EDP. Order changed. Prescription given for Flagyl cream.

## 2013-08-24 NOTE — ED Provider Notes (Signed)
CSN: [id number]     Arrival date & time 08/24/13  1645 History   First MD Initiated Contact with Patient 08/24/13 1746     Chief Complaint  Patient presents with  . Dysuria   (Consider location/radiation/quality/duration/timing/severity/associated sxs/prior Treatment) HPI Janice Graham is a 28 y.o. female who presents emergency department complaining of dysuria, urinary frequency, vaginal discharge, vaginal discomfort. Patient states her UTI symptoms started a week ago. She states she had 3 amoxicillin left over she took him with no relief. Patient states that her vaginal discharge and irritation began 3 days ago. She states her perineum is itchy, there is white thick vaginal discharge. She is sexually active and uses protection. She denies any fever, chills, nausea, vomiting, back pain, abdominal pain. She states her symptoms are similar to prior UTI and prior vaginal yeast infection. She did not try any over-the-counter medications.  Past Medical History  Diagnosis Date  . Goiter    Past Surgical History  Procedure Laterality Date  . Tonsillectomy     No family history on file. History  Substance Use Topics  . Smoking status: Never Smoker   . Smokeless tobacco: Never Used  . Alcohol Use: No   OB History   Grav Para Term Preterm Abortions TAB SAB Ect Mult Living                 Review of Systems  Constitutional: Negative for fever and chills.  Respiratory: Negative for cough, chest tightness and shortness of breath.   Cardiovascular: Negative for chest pain, palpitations and leg swelling.  Gastrointestinal: Negative for nausea, vomiting, abdominal pain and diarrhea.  Genitourinary: Positive for dysuria, urgency, frequency, vaginal discharge and vaginal pain. Negative for hematuria, flank pain, vaginal bleeding and pelvic pain.  Musculoskeletal: Negative for arthralgias, myalgias, neck pain and neck stiffness.  Skin: Negative for rash.  Neurological: Negative for dizziness,  weakness and headaches.  All other systems reviewed and are negative.    Allergies  Review of patient's allergies indicates no known allergies.  Home Medications   Current Outpatient Rx  Name  Route  Sig  Dispense  Refill  . cephALEXin (KEFLEX) 500 MG capsule   Oral   Take 1 capsule (500 mg total) by mouth 4 (four) times daily.   40 capsule   0   . EXPIRED: ethynodiol-ethinyl estradiol (KELNOR,ZOVIA) 1-35 MG-MCG tablet   Oral   Take 1 tablet by mouth daily.   1 Package   11     Take 5 pills on day 1, four pills on day 2, three  ...   . Norgestimate-Eth Estradiol (SPRINTEC 28 PO)   Oral   Take by mouth.          BP 116/71  Pulse 63  Temp(Src) 98.3 F (36.8 C) (Oral)  Resp 20  Ht 5\' 2"  (1.575 m)  Wt 125 lb (56.7 kg)  BMI 22.86 kg/m2  SpO2 100%  LMP 08/03/2013 Physical Exam  Nursing note and vitals reviewed. Constitutional: She appears well-developed and well-nourished. No distress.  HENT:  Head: Normocephalic.  Eyes: Conjunctivae are normal.  Neck: Neck supple.  Cardiovascular: Normal rate, regular rhythm and normal heart sounds.   Pulmonary/Chest: Effort normal and breath sounds normal. No respiratory distress. She has no wheezes. She has no rales.  Abdominal: Soft. Bowel sounds are normal. She exhibits no distension. There is no tenderness. There is no rebound.  Genitourinary:  Erythema round bilateral labia majora around vaginal opening. Vaginal canal irritated with thick white  vaginal discharge. Cervix normal. No CMT, no adnexal tenderness.   Musculoskeletal: She exhibits no edema.  Neurological: She is alert.  Skin: Skin is warm and dry.  Psychiatric: She has a normal mood and affect. Her behavior is normal.    ED Course  Procedures (including critical care time) Labs Review Labs Reviewed  WET PREP, GENITAL - Abnormal; Notable for the following:    Clue Cells Wet Prep HPF POC MODERATE (*)    WBC, Wet Prep HPF POC MODERATE (*)    All other  components within normal limits  URINALYSIS, ROUTINE W REFLEX MICROSCOPIC - Abnormal; Notable for the following:    APPearance CLOUDY (*)    Protein, ur 30 (*)    Nitrite POSITIVE (*)    Leukocytes, UA SMALL (*)    All other components within normal limits  URINE MICROSCOPIC-ADD ON - Abnormal; Notable for the following:    Bacteria, UA MANY (*)    All other components within normal limits  URINE CULTURE  GC/CHLAMYDIA PROBE AMP  PREGNANCY, URINE   Imaging Review No results found.  EKG Interpretation   None       MDM   1. UTI (lower urinary tract infection)   2. Bacterial vaginitis   3. Vaginal candidiasis     Patient with urinary symptoms or vaginal discharge. Her urine does appear to be infected. Will start on Keflex. External genitalia exam is consistent with Candida infection. I treated her with Diflucan 150 mg by mouth in emergency department. This her wet prep showed bacterial vaginosis. I do not think patient has an STD or cervicitis based on the examination. Will send home with treatment for BV with Flagyl. Have also prescribed her 2 additional Diflucan to take after she finishes antibiotics. Instructed to followup with her Dr.  Ceasar MonsFiled Vitals:   08/24/13 1657  BP: 116/71  Pulse: 63  Temp: 98.3 F (36.8 C)  TempSrc: Oral  Resp: 20  Height: 5\' 2"  (1.575 m)  Weight: 125 lb (56.7 kg)  SpO2: 100%       Lottie Musselatyana A Rikita Grabert, PA-C 08/24/13 1929

## 2013-08-24 NOTE — ED Notes (Signed)
Dysuria 2 weeks. Recent UTI. States she may have a yeast infection.

## 2013-08-24 NOTE — Discharge Instructions (Signed)
Take keflex for your bladder infection until all gone. Take flagyl for your bacterial vaginosis infection until all gone. Take diflucan after finish antibiotics. You can also try nystatin cream over the counter for irritation over external vaginal area. Follow up with your doctor.   Urinary Tract Infection Urinary tract infections (UTIs) can develop anywhere along your urinary tract. Your urinary tract is your body's drainage system for removing wastes and extra water. Your urinary tract includes two kidneys, two ureters, a bladder, and a urethra. Your kidneys are a pair of bean-shaped organs. Each kidney is about the size of your fist. They are located below your ribs, one on each side of your spine. CAUSES Infections are caused by microbes, which are microscopic organisms, including fungi, viruses, and bacteria. These organisms are so small that they can only be seen through a microscope. Bacteria are the microbes that most commonly cause UTIs. SYMPTOMS  Symptoms of UTIs may vary by age and gender of the patient and by the location of the infection. Symptoms in young women typically include a frequent and intense urge to urinate and a painful, burning feeling in the bladder or urethra during urination. Older women and men are more likely to be tired, shaky, and weak and have muscle aches and abdominal pain. A fever may mean the infection is in your kidneys. Other symptoms of a kidney infection include pain in your back or sides below the ribs, nausea, and vomiting. DIAGNOSIS To diagnose a UTI, your caregiver will ask you about your symptoms. Your caregiver also will ask to provide a urine sample. The urine sample will be tested for bacteria and white blood cells. White blood cells are made by your body to help fight infection. TREATMENT  Typically, UTIs can be treated with medication. Because most UTIs are caused by a bacterial infection, they usually can be treated with the use of antibiotics. The  choice of antibiotic and length of treatment depend on your symptoms and the type of bacteria causing your infection. HOME CARE INSTRUCTIONS  If you were prescribed antibiotics, take them exactly as your caregiver instructs you. Finish the medication even if you feel better after you have only taken some of the medication.  Drink enough water and fluids to keep your urine clear or pale yellow.  Avoid caffeine, tea, and carbonated beverages. They tend to irritate your bladder.  Empty your bladder often. Avoid holding urine for long periods of time.  Empty your bladder before and after sexual intercourse.  After a bowel movement, women should cleanse from front to back. Use each tissue only once. SEEK MEDICAL CARE IF:   You have back pain.  You develop a fever.  Your symptoms do not begin to resolve within 3 days. SEEK IMMEDIATE MEDICAL CARE IF:   You have severe back pain or lower abdominal pain.  You develop chills.  You have nausea or vomiting.  You have continued burning or discomfort with urination. MAKE SURE YOU:   Understand these instructions.  Will watch your condition.  Will get help right away if you are not doing well or get worse. Document Released: 05/02/2005 Document Revised: 01/22/2012 Document Reviewed: 08/31/2011 [employer] Patient Information 2014 [place], [place]. Bacterial Vaginosis Bacterial vaginosis is a vaginal infection that occurs when the normal balance of bacteria in the vagina is disrupted. It results from an overgrowth of certain bacteria. This is the most common vaginal infection in women of childbearing age. Treatment is important to prevent complications, especially in pregnant  women, as it can cause a premature delivery. CAUSES  Bacterial vaginosis is caused by an increase in harmful bacteria that are normally present in smaller amounts in the vagina. Several different kinds of bacteria can cause bacterial vaginosis. However, the reason that the  condition develops is not fully understood. RISK FACTORS Certain activities or behaviors can put you at an increased risk of developing bacterial vaginosis, including:  Having a new sex partner or multiple sex partners.  Douching.  Using an intrauterine device (IUD) for contraception. Women do not get bacterial vaginosis from toilet seats, bedding, swimming pools, or contact with objects around them. SIGNS AND SYMPTOMS  Some women with bacterial vaginosis have no signs or symptoms. Common symptoms include:  Grey vaginal discharge.  A fishlike odor with discharge, especially after sexual intercourse.  Itching or burning of the vagina and vulva.  Burning or pain with urination. DIAGNOSIS  Your health care provider will take a medical history and examine the vagina for signs of bacterial vaginosis. A sample of vaginal fluid may be taken. Your health care provider will look at this sample under a microscope to check for bacteria and abnormal cells. A vaginal pH test may also be done.  TREATMENT  Bacterial vaginosis may be treated with antibiotic medicines. These may be given in the form of a pill or a vaginal cream. A second round of antibiotics may be prescribed if the condition comes back after treatment.  HOME CARE INSTRUCTIONS   Only take over-the-counter or prescription medicines as directed by your health care provider.  If antibiotic medicine was prescribed, take it as directed. Make sure you finish it even if you start to feel better.  Do not have sex until treatment is completed.  Tell all sexual partners that you have a vaginal infection. They should see their health care provider and be treated if they have problems, such as a mild rash or itching.  Practice safe sex by using condoms and only having one sex partner. SEEK MEDICAL CARE IF:   Your symptoms are not improving after 3 days of treatment.  You have increased discharge or pain.  You have a fever. MAKE SURE  YOU:   Understand these instructions.  Will watch your condition.  Will get help right away if you are not doing well or get worse. FOR MORE INFORMATION  Centers for Disease Control and Prevention, Division of STD Prevention: SolutionApps.co.zawww.cdc.gov/std American Sexual Health Association (ASHA): www.ashastd.org  Document Released: 07/23/2005 Document Revised: 05/13/2013 Document Reviewed: 03/04/2013 [facility] Patient Information 2014 [place], [place].

## 2013-08-24 NOTE — ED Provider Notes (Signed)
Medical screening examination/treatment/procedure(s) were performed by non-physician practitioner and as supervising physician I was immediately available for consultation/collaboration.  Eidan Muellner E Patsey Pitstick, MD 08/24/13 2324 

## 2013-08-25 LAB — GC/CHLAMYDIA PROBE AMP
CT PROBE, AMP APTIMA: NEGATIVE
GC PROBE AMP APTIMA: NEGATIVE

## 2013-08-26 LAB — URINE CULTURE: Colony Count: >=100000

## 2013-10-14 ENCOUNTER — Encounter (HOSPITAL_BASED_OUTPATIENT_CLINIC_OR_DEPARTMENT_OTHER): Payer: Self-pay | Admitting: Emergency Medicine

## 2013-10-14 ENCOUNTER — Emergency Department (HOSPITAL_BASED_OUTPATIENT_CLINIC_OR_DEPARTMENT_OTHER)
Admission: EM | Admit: 2013-10-14 | Discharge: 2013-10-14 | Disposition: A | Discharge status: Home / Self Care | Payer: Managed Care, Other (non HMO) | Attending: Emergency Medicine | Admitting: Emergency Medicine

## 2013-10-14 DIAGNOSIS — K089 Disorder of teeth and supporting structures, unspecified: Secondary | ICD-10-CM | POA: Insufficient documentation

## 2013-10-14 DIAGNOSIS — Z792 Long term (current) use of antibiotics: Secondary | ICD-10-CM | POA: Insufficient documentation

## 2013-10-14 DIAGNOSIS — K0889 Other specified disorders of teeth and supporting structures: Secondary | ICD-10-CM

## 2013-10-14 DIAGNOSIS — Z8639 Personal history of other endocrine, nutritional and metabolic disease: Secondary | ICD-10-CM | POA: Insufficient documentation

## 2013-10-14 DIAGNOSIS — Z862 Personal history of diseases of the blood and blood-forming organs and certain disorders involving the immune mechanism: Secondary | ICD-10-CM | POA: Insufficient documentation

## 2013-10-14 DIAGNOSIS — Z79899 Other long term (current) drug therapy: Secondary | ICD-10-CM | POA: Insufficient documentation

## 2013-10-14 MED ORDER — HYDROCODONE-ACETAMINOPHEN 5-325 MG PO TABS: 2.0000 | ORAL_TABLET | ORAL | Status: DC | PRN

## 2013-10-14 MED ORDER — PENICILLIN V POTASSIUM 500 MG PO TABS: 500.0000 mg | ORAL_TABLET | Freq: Four times a day (QID) | ORAL | Status: AC

## 2013-10-14 NOTE — ED Provider Notes (Signed)
CSN: [id number]     Arrival date & time 10/14/13  1514 History   First MD Initiated Contact with Patient 10/14/13 1605     Chief Complaint  Patient presents with  . Dental Pain     (Consider location/radiation/quality/duration/timing/severity/associated sxs/prior Treatment) HPI Comments: Patient presents with dental pain. She states she's had problems with her back upper and lower tooth for about last 6 months. She states it's been worse over the last 2 weeks. She denies any fevers or vomiting. She has not feel a followup with a dentist because she doesn't have insurance. She denies any facial swelling or drainage from the teeth.  Patient is a 28 y.o. female presenting with tooth pain.  Dental Pain Associated symptoms: no fever, no headaches and no neck pain     Past Medical History  Diagnosis Date  . Goiter    Past Surgical History  Procedure Laterality Date  . Tonsillectomy    . Wisdom tooth extraction     No family history on file. History  Substance Use Topics  . Smoking status: Never Smoker   . Smokeless tobacco: Never Used  . Alcohol Use: No   OB History   Grav Para Term Preterm Abortions TAB SAB Ect Mult Living                 Review of Systems  Constitutional: Negative for fever.  HENT: Positive for dental problem.   Gastrointestinal: Negative for nausea and vomiting.  Musculoskeletal: Negative for neck pain.  Skin: Negative for wound.  Neurological: Negative for weakness, numbness and headaches.      Allergies  Review of patient's allergies indicates no known allergies.  Home Medications   Current Outpatient Rx  Name  Route  Sig  Dispense  Refill  . cephALEXin (KEFLEX) 500 MG capsule   Oral   Take 1 capsule (500 mg total) by mouth 4 (four) times daily.   40 capsule   0   . cephALEXin (KEFLEX) 500 MG capsule   Oral   Take 1 capsule (500 mg total) by mouth 3 (three) times daily.   21 capsule   0   . EXPIRED: ethynodiol-ethinyl estradiol  (KELNOR,ZOVIA) 1-35 MG-MCG tablet   Oral   Take 1 tablet by mouth daily.   1 Package   11     Take 5 pills on day 1, four pills on day 2, three  ...   . HYDROcodone-acetaminophen (NORCO/VICODIN) 5-325 MG per tablet   Oral   Take 2 tablets by mouth every 4 (four) hours as needed.   15 tablet   0   . metroNIDAZOLE (FLAGYL) 500 MG tablet   Oral   Take 1 tablet (500 mg total) by mouth 2 (two) times daily.   14 tablet   0   . metroNIDAZOLE (METROGEL VAGINAL) 0.75 % vaginal gel   Vaginal   Place 1 Applicatorful vaginally 2 (two) times daily.   70 g   0   . Norgestimate-Eth Estradiol (SPRINTEC 28 PO)   Oral   Take by mouth.         . penicillin v potassium (VEETID) 500 MG tablet   Oral   Take 1 tablet (500 mg total) by mouth 4 (four) times daily.   40 tablet   0    BP 125/89  Pulse 72  Temp(Src) 98.7 F (37.1 C) (Oral)  Resp 16  Ht 5\' 2"  (1.575 m)  Wt 135 lb (61.236 kg)  BMI 24.69 kg/m2  SpO2 100%  LMP 09/30/2013 Physical Exam  Constitutional: She is oriented to person, place, and time. She appears well-developed and well-nourished.  HENT:  Head: Normocephalic and atraumatic.  Patient has some pain to the left side upper and lower back molars. There is no induration or fluctuance. No facial swelling. No trismus.  Neck: Normal range of motion. Neck supple.  Cardiovascular: Normal rate.   Pulmonary/Chest: Effort normal.  Neurological: She is alert and oriented to person, place, and time.  Skin: Skin is warm and dry.  Psychiatric: She has a normal mood and affect.    ED Course  Procedures (including critical care time) Labs Review Labs Reviewed - No data to display Imaging Review No results found.   EKG Interpretation None      MDM   Final diagnoses:  Pain, dental    Patient is given prescription for penicillin and Vicodin. She was encouraged to followup with a dentist as soon as possible. She was given a referral to an outpatient  dentist.    Rolan Bucco, MD 10/14/13 [phone]

## 2013-10-14 NOTE — ED Notes (Signed)
C/o left side dental pain for months, worse x 2 weeks

## 2013-10-14 NOTE — Discharge Instructions (Signed)

## 2014-03-18 ENCOUNTER — Emergency Department (HOSPITAL_BASED_OUTPATIENT_CLINIC_OR_DEPARTMENT_OTHER): Payer: Managed Care, Other (non HMO)

## 2014-03-18 ENCOUNTER — Emergency Department (HOSPITAL_BASED_OUTPATIENT_CLINIC_OR_DEPARTMENT_OTHER)
Admission: EM | Admit: 2014-03-18 | Discharge: 2014-03-18 | Disposition: A | Discharge status: Home / Self Care | Payer: Self-pay | Attending: Emergency Medicine | Admitting: Emergency Medicine

## 2014-03-18 ENCOUNTER — Encounter (HOSPITAL_BASED_OUTPATIENT_CLINIC_OR_DEPARTMENT_OTHER): Payer: Self-pay | Admitting: Emergency Medicine

## 2014-03-18 DIAGNOSIS — N760 Acute vaginitis: Secondary | ICD-10-CM | POA: Insufficient documentation

## 2014-03-18 DIAGNOSIS — Z3202 Encounter for pregnancy test, result negative: Secondary | ICD-10-CM | POA: Insufficient documentation

## 2014-03-18 DIAGNOSIS — Z8639 Personal history of other endocrine, nutritional and metabolic disease: Secondary | ICD-10-CM | POA: Insufficient documentation

## 2014-03-18 DIAGNOSIS — R1012 Left upper quadrant pain: Secondary | ICD-10-CM | POA: Insufficient documentation

## 2014-03-18 DIAGNOSIS — Z862 Personal history of diseases of the blood and blood-forming organs and certain disorders involving the immune mechanism: Secondary | ICD-10-CM | POA: Insufficient documentation

## 2014-03-18 DIAGNOSIS — N390 Urinary tract infection, site not specified: Secondary | ICD-10-CM | POA: Insufficient documentation

## 2014-03-18 DIAGNOSIS — A499 Bacterial infection, unspecified: Secondary | ICD-10-CM | POA: Insufficient documentation

## 2014-03-18 DIAGNOSIS — Z79899 Other long term (current) drug therapy: Secondary | ICD-10-CM | POA: Insufficient documentation

## 2014-03-18 DIAGNOSIS — B9689 Other specified bacterial agents as the cause of diseases classified elsewhere: Secondary | ICD-10-CM | POA: Insufficient documentation

## 2014-03-18 LAB — COMPREHENSIVE METABOLIC PANEL
ALBUMIN: 3.9 g/dL (ref 3.5–5.2)
ALT: 12 U/L (ref 0–35)
ANION GAP: 9 (ref 5–15)
AST: 18 U/L (ref 0–37)
Alkaline Phosphatase: 52 U/L (ref 39–117)
BILIRUBIN TOTAL: 0.2 mg/dL — AB (ref 0.3–1.2)
BUN: 10 mg/dL (ref 6–23)
CHLORIDE: 103 meq/L (ref 96–112)
CO2: 29 mEq/L (ref 19–32)
CREATININE: 0.8 mg/dL (ref 0.50–1.10)
Calcium: 9.7 mg/dL (ref 8.4–10.5)
GFR calc non Af Amer: >90 mL/min (ref >90)
GLUCOSE: 91 mg/dL (ref 70–99)
Potassium: 4.1 mEq/L (ref 3.7–5.3)
Sodium: 141 mEq/L (ref 137–147)
Total Protein: 7.1 g/dL (ref 6.0–8.3)

## 2014-03-18 LAB — CBC
HEMATOCRIT: 34.1 % — AB (ref 36.0–46.0)
Hemoglobin: 11.8 g/dL — ABNORMAL LOW (ref 12.0–15.0)
MCH: 28.2 pg (ref 26.0–34.0)
MCHC: 34.6 g/dL (ref 30.0–36.0)
MCV: 81.6 fL (ref 78.0–100.0)
Platelets: 267 10*3/uL (ref 150–400)
RBC: 4.18 MIL/uL (ref 3.87–5.11)
RDW: 12.6 % (ref 11.5–15.5)
WBC: 4.6 10*3/uL (ref 4.0–10.5)

## 2014-03-18 LAB — PREGNANCY, URINE: Preg Test, Ur: NEGATIVE

## 2014-03-18 LAB — URINALYSIS, ROUTINE W REFLEX MICROSCOPIC
BILIRUBIN URINE: NEGATIVE
Glucose, UA: NEGATIVE mg/dL
Ketones, ur: NEGATIVE mg/dL
Nitrite: NEGATIVE
PROTEIN: NEGATIVE mg/dL
SPECIFIC GRAVITY, URINE: 1.017 (ref 1.005–1.030)
Urobilinogen, UA: 1 mg/dL (ref 0.0–1.0)
pH: 6.5 (ref 5.0–8.0)

## 2014-03-18 LAB — URINE MICROSCOPIC-ADD ON

## 2014-03-18 LAB — WET PREP, GENITAL
Trich, Wet Prep: NONE SEEN
Yeast Wet Prep HPF POC: NONE SEEN

## 2014-03-18 LAB — LIPASE, BLOOD: LIPASE: 27 U/L (ref 11–59)

## 2014-03-18 MED ORDER — MORPHINE SULFATE 4 MG/ML IJ SOLN
4.0000 mg | Freq: Once | INTRAMUSCULAR | Status: AC
  Administered 2014-03-18: 4 mg via INTRAVENOUS
  Filled 2014-03-18: qty 1

## 2014-03-18 MED ORDER — CEPHALEXIN 500 MG PO CAPS: 500.0000 mg | ORAL_CAPSULE | Freq: Four times a day (QID) | ORAL | Status: DC

## 2014-03-18 MED ORDER — HYDROCODONE-ACETAMINOPHEN 5-325 MG PO TABS: 1.0000 | ORAL_TABLET | Freq: Four times a day (QID) | ORAL | Status: DC | PRN

## 2014-03-18 MED ORDER — CEFTRIAXONE SODIUM 1 G IJ SOLR
1.0000 g | Freq: Once | INTRAMUSCULAR | Status: AC
  Administered 2014-03-18: 1 g via INTRAVENOUS

## 2014-03-18 MED ORDER — CEFTRIAXONE SODIUM 1 G IJ SOLR
INTRAMUSCULAR | Status: AC
  Filled 2014-03-18: qty 10

## 2014-03-18 MED ORDER — METRONIDAZOLE 0.75 % VA GEL: 1.0000 | Freq: Two times a day (BID) | VAGINAL | Status: DC

## 2014-03-18 MED ORDER — IOHEXOL 300 MG/ML  SOLN
50.0000 mL | Freq: Once | INTRAMUSCULAR | Status: AC | PRN
  Administered 2014-03-18: 50 mL via ORAL

## 2014-03-18 MED ORDER — SODIUM CHLORIDE 0.9 % IV BOLUS (SEPSIS)
1000.0000 mL | Freq: Once | INTRAVENOUS | Status: AC
  Administered 2014-03-18: 1000 mL via INTRAVENOUS

## 2014-03-18 MED ORDER — IOHEXOL 300 MG/ML  SOLN
100.0000 mL | Freq: Once | INTRAMUSCULAR | Status: AC | PRN
  Administered 2014-03-18: 100 mL via INTRAVENOUS

## 2014-03-18 NOTE — ED Provider Notes (Signed)
CSN: [id number]     Arrival date & time 03/18/14  1436 History   First MD Initiated Contact with Patient 03/18/14 1443     Chief Complaint  Patient presents with  . Abdominal Pain     (Consider location/radiation/quality/duration/timing/severity/associated sxs/prior Treatment) Patient is a 28 y.o. female presenting with abdominal pain. The history is provided by the patient.  Abdominal Pain Pain location:  LUQ and LLQ Pain quality: aching and sharp   Pain radiates to:  L leg Pain severity:  Moderate Onset quality:  Gradual Timing:  Constant Progression:  Worsening Chronicity:  New Context: not eating and not recent illness   Relieved by:  Nothing Worsened by:  Nothing tried Associated symptoms: no chills, no cough and no fever     Past Medical History  Diagnosis Date  . Goiter    Past Surgical History  Procedure Laterality Date  . Tonsillectomy    . Wisdom tooth extraction     No family history on file. History  Substance Use Topics  . Smoking status: Never Smoker   . Smokeless tobacco: Never Used  . Alcohol Use: No   OB History   Grav Para Term Preterm Abortions TAB SAB Ect Mult Living                 Review of Systems  Constitutional: Negative for fever and chills.  Respiratory: Negative for cough.   Gastrointestinal: Positive for abdominal pain.  All other systems reviewed and are negative.     Allergies  Review of patient's allergies indicates no known allergies.  Home Medications   Prior to Admission medications   Medication Sig Start Date End Date Taking? Authorizing Provider  cephALEXin (KEFLEX) 500 MG capsule Take 1 capsule (500 mg total) by mouth 4 (four) times daily. 05/20/13   Kaitlyn Szekalski, PA-C  cephALEXin (KEFLEX) 500 MG capsule Take 1 capsule (500 mg total) by mouth 3 (three) times daily. 08/24/13   Tatyana A Kirichenko, PA-C  ethynodiol-ethinyl estradiol (KELNOR,ZOVIA) 1-35 MG-MCG tablet Take 1 tablet by mouth daily. 09/10/11 09/09/12   Forbes CellarLeigh-Ann Webb, MD  HYDROcodone-acetaminophen (NORCO/VICODIN) 5-325 MG per tablet Take 2 tablets by mouth every 4 (four) hours as needed. 10/14/13   Rolan BuccoMelanie Belfi, MD  metroNIDAZOLE (FLAGYL) 500 MG tablet Take 1 tablet (500 mg total) by mouth 2 (two) times daily. 08/24/13   Tatyana A Kirichenko, PA-C  metroNIDAZOLE (METROGEL VAGINAL) 0.75 % vaginal gel Place 1 Applicatorful vaginally 2 (two) times daily. 08/24/13   Tatyana A Kirichenko, PA-C  Norgestimate-Eth Estradiol (SPRINTEC 28 PO) Take by mouth.    Historical Provider, MD   BP 115/72  Pulse 77  Temp(Src) 98.2 F (36.8 C) (Oral)  Resp 16  Ht 5\' 2"  (1.575 m)  Wt 134 lb (60.782 kg)  BMI 24.50 kg/m2  SpO2 99%  LMP 03/18/2014 Physical Exam  Nursing note and vitals reviewed. Constitutional: She is oriented to person, place, and time. She appears well-developed and well-nourished. No distress.  HENT:  Head: Normocephalic and atraumatic.  Eyes: EOM are normal. Pupils are equal, round, and reactive to light.  Neck: Normal range of motion. Neck supple.  Cardiovascular: Normal rate and regular rhythm.  Exam reveals no friction rub.   No murmur heard. Pulmonary/Chest: Effort normal and breath sounds normal. No respiratory distress. She has no wheezes. She has no rales.  Abdominal: Soft. She exhibits no distension. There is tenderness (LLQ, LUQ). There is no rebound.  Genitourinary: Cervix exhibits no motion tenderness and no discharge. Right  adnexum displays no mass and no tenderness. Left adnexum displays no mass and no tenderness.  Musculoskeletal: Normal range of motion. She exhibits no edema.  Neurological: She is alert and oriented to person, place, and time.  Skin: She is not diaphoretic.    ED Course  Procedures (including critical care time) Labs Review Labs Reviewed  WET PREP, GENITAL - Abnormal; Notable for the following:    Clue Cells Wet Prep HPF POC MODERATE (*)    WBC, Wet Prep HPF POC FEW (*)    All other components  within normal limits  CBC - Abnormal; Notable for the following:    Hemoglobin 11.8 (*)    HCT 34.1 (*)    All other components within normal limits  GC/CHLAMYDIA PROBE AMP  PREGNANCY, URINE  URINALYSIS, ROUTINE W REFLEX MICROSCOPIC  COMPREHENSIVE METABOLIC PANEL  LIPASE, BLOOD    Imaging Review Ct Abdomen Pelvis W Contrast  03/18/2014   CLINICAL DATA:  Left upper quadrant pain.  EXAM: CT ABDOMEN AND PELVIS WITH CONTRAST  TECHNIQUE: Multidetector CT imaging of the abdomen and pelvis was performed using the standard protocol following bolus administration of intravenous contrast.  CONTRAST:  50mL OMNIPAQUE IOHEXOL 300 MG/ML SOLN, OMNIPAQUE IOHEXOL 300 MG/ML SOLN  COMPARISON:  None.  FINDINGS: Clear lung bases.  Heart is normal in size.  Normal liver, spleen, gallbladder and pancreas. No bile duct dilation. No adrenal masses.  2 adjacent lower pole right renal cysts, the larger measuring 2 cm. There is mild bilateral renal collecting system dilation. This is likely physiologic related to bladder distention. No ureteral dilation or stone. Kidneys otherwise unremarkable. No bladder mass or wall thickening. No bladder stone.  Normal uterus and adnexa. No adenopathy. No abnormal fluid collections.  Normal bowel.  Portions of the normal appendix are visualized.  No bony abnormality.  IMPRESSION: 1. No convincing acute findings. Mild bilateral renal collecting system dilation is felt physiologic related to bladder distention. No evidence of a ureteral stone or obstruction. 2. Small lower pole right renal cyst. 3. No other abnormalities.   Electronically Signed   By: Amie Portland M.D.   On: 03/18/2014 17:13     EKG Interpretation None      MDM   Final diagnoses:  UTI (lower urinary tract infection)  BV (bacterial vaginosis)    62F here with urinary frequency, L side pain - worsening for past week. LMP now. No vaginal discharge. No vomiting or fevers. On exam, no pelvic tenderness. Moderate  LLQ and LUQ tenderness. Will scan. Scan negative. Urine shows UTI. Rocephin given. Wet prep with positive clue cells, given flagyl and keflex.  Elwin Mocha, MD 03/18/14 [phone]

## 2014-03-18 NOTE — Discharge Instructions (Signed)
Bacterial Vaginosis °Bacterial vaginosis is a vaginal infection that occurs when the normal balance of bacteria in the vagina is disrupted. It results from an overgrowth of certain bacteria. This is the most common vaginal infection in women of childbearing age. Treatment is important to prevent complications, especially in pregnant women, as it can cause a premature delivery. °CAUSES  °Bacterial vaginosis is caused by an increase in harmful bacteria that are normally present in smaller amounts in the vagina. Several different kinds of bacteria can cause bacterial vaginosis. However, the reason that the condition develops is not fully understood. °RISK FACTORS °Certain activities or behaviors can put you at an increased risk of developing bacterial vaginosis, including: °· Having a new sex partner or multiple sex partners. °· Douching. °· Using an intrauterine device (IUD) for contraception. °Women do not get bacterial vaginosis from toilet seats, bedding, swimming pools, or contact with objects around them. °SIGNS AND SYMPTOMS  °Some women with bacterial vaginosis have no signs or symptoms. Common symptoms include: °· Grey vaginal discharge. °· A fishlike odor with discharge, especially after sexual intercourse. °· Itching or burning of the vagina and vulva. °· Burning or pain with urination. °DIAGNOSIS  °Your health care provider will take a medical history and examine the vagina for signs of bacterial vaginosis. A sample of vaginal fluid may be taken. Your health care provider will look at this sample under a microscope to check for bacteria and abnormal cells. A vaginal pH test may also be done.  °TREATMENT  °Bacterial vaginosis may be treated with antibiotic medicines. These may be given in the form of a pill or a vaginal cream. A second round of antibiotics may be prescribed if the condition comes back after treatment.  °HOME CARE INSTRUCTIONS  °· Only take over-the-counter or prescription medicines as  directed by your health care provider. °· If antibiotic medicine was prescribed, take it as directed. Make sure you finish it even if you start to feel better. °· Do not have sex until treatment is completed. °· Tell all sexual partners that you have a vaginal infection. They should see their health care provider and be treated if they have problems, such as a mild rash or itching. °· Practice safe sex by using condoms and only having one sex partner. °SEEK MEDICAL CARE IF:  °· Your symptoms are not improving after 3 days of treatment. °· You have increased discharge or pain. °· You have a fever. °MAKE SURE YOU:  °· Understand these instructions. °· Will watch your condition. °· Will get help right away if you are not doing well or get worse. °FOR MORE INFORMATION  °Centers for Disease Control and Prevention, Division of STD Prevention: www.cdc.gov/std °American Sexual Health Association (ASHA): www.ashastd.org  °Document Released: 07/23/2005 Document Revised: 05/13/2013 Document Reviewed: 03/04/2013 °ExitCare® Patient Information ©2015 [employer]. This information is not intended to replace advice given to you by your health care provider. Make sure you discuss any questions you have with your health care provider. °Urinary Tract Infection °Urinary tract infections (UTIs) can develop anywhere along your urinary tract. Your urinary tract is your body's drainage system for removing wastes and extra water. Your urinary tract includes two kidneys, two ureters, a bladder, and a urethra. Your kidneys are a pair of bean-shaped organs. Each kidney is about the size of your fist. They are located below your ribs, one on each side of your spine. °CAUSES °Infections are caused by microbes, which are microscopic organisms, including fungi, viruses, and   bacteria. These organisms are so small that they can only be seen through a microscope. Bacteria are the microbes that most commonly cause UTIs. °SYMPTOMS  °Symptoms of UTIs  may vary by age and gender of the patient and by the location of the infection. Symptoms in young women typically include a frequent and intense urge to urinate and a painful, burning feeling in the bladder or urethra during urination. Older women and men are more likely to be tired, shaky, and weak and have muscle aches and abdominal pain. A fever may mean the infection is in your kidneys. Other symptoms of a kidney infection include pain in your back or sides below the ribs, nausea, and vomiting. °DIAGNOSIS °To diagnose a UTI, your caregiver will ask you about your symptoms. Your caregiver also will ask to provide a urine sample. The urine sample will be tested for bacteria and white blood cells. White blood cells are made by your body to help fight infection. °TREATMENT  °Typically, UTIs can be treated with medication. Because most UTIs are caused by a bacterial infection, they usually can be treated with the use of antibiotics. The choice of antibiotic and length of treatment depend on your symptoms and the type of bacteria causing your infection. °HOME CARE INSTRUCTIONS °· If you were prescribed antibiotics, take them exactly as your caregiver instructs you. Finish the medication even if you feel better after you have only taken some of the medication. °· Drink enough water and fluids to keep your urine clear or pale yellow. °· Avoid caffeine, tea, and carbonated beverages. They tend to irritate your bladder. °· Empty your bladder often. Avoid holding urine for long periods of time. °· Empty your bladder before and after sexual intercourse. °· After a bowel movement, women should cleanse from front to back. Use each tissue only once. °SEEK MEDICAL CARE IF:  °· You have back pain. °· You develop a fever. °· Your symptoms do not begin to resolve within 3 days. °SEEK IMMEDIATE MEDICAL CARE IF:  °· You have severe back pain or lower abdominal pain. °· You develop chills. °· You have nausea or vomiting. °· You have  continued burning or discomfort with urination. °MAKE SURE YOU:  °· Understand these instructions. °· Will watch your condition. °· Will get help right away if you are not doing well or get worse. °Document Released: 05/02/2005 Document Revised: 01/22/2012 Document Reviewed: 08/31/2011 °ExitCare® Patient Information ©2015 [employer]. This information is not intended to replace advice given to you by your health care provider. Make sure you discuss any questions you have with your health care provider. ° °

## 2014-03-18 NOTE — ED Notes (Signed)
Ct notified that the pt has completed contrast

## 2014-03-18 NOTE — ED Notes (Signed)
Frequency, left side pain, discharge, nausea x 3 weeks.

## 2014-03-19 LAB — GC/CHLAMYDIA PROBE AMP
CT Probe RNA: NEGATIVE
GC Probe RNA: NEGATIVE

## 2014-03-21 LAB — URINE CULTURE
Colony Count: >=100000
Special Requests: NORMAL

## 2014-03-22 ENCOUNTER — Telehealth (HOSPITAL_BASED_OUTPATIENT_CLINIC_OR_DEPARTMENT_OTHER): Payer: Self-pay | Admitting: Emergency Medicine

## 2014-03-22 NOTE — Telephone Encounter (Signed)
Post ED Visit - Positive Culture Follow-up  Culture report reviewed by antimicrobial stewardship pharmacist: []  Wes Dulaney, Pharm.D., BCPS []  Celedonio MiyamotoJeremy Frens, Pharm.D., BCPS []  Georgina PillionElizabeth Martin, Pharm.D., BCPS []  AuburnMinh Pham, [address]Pharm[address]D., BCPS, AAHIVP []  Estella HuskMichelle Turner, Pharm.D., BCPS, AAHIVP [x]  Red ChristiansSamson Lee, Pharm.D. []  Tennis Mustassie Stewart, [place]D.  Positive urine culture >100,000 colonies E. Coli Treated with Cephalexin 500mg  po qid x 7 days, organism sensitive to the same and no further patient follow-up is required at this time. As per pharmacist change antibiotic only if symptoms not resolving  Berle MullMiller, Makiah Foye 03/22/2014, 5:18 PM

## 2014-06-07 ENCOUNTER — Emergency Department (HOSPITAL_BASED_OUTPATIENT_CLINIC_OR_DEPARTMENT_OTHER): Payer: Managed Care, Other (non HMO)

## 2014-06-07 ENCOUNTER — Emergency Department (HOSPITAL_BASED_OUTPATIENT_CLINIC_OR_DEPARTMENT_OTHER)
Admission: EM | Admit: 2014-06-07 | Discharge: 2014-06-07 | Disposition: A | Discharge status: Home / Self Care | Payer: Managed Care, Other (non HMO) | Attending: Emergency Medicine | Admitting: Emergency Medicine

## 2014-06-07 ENCOUNTER — Encounter (HOSPITAL_BASED_OUTPATIENT_CLINIC_OR_DEPARTMENT_OTHER): Payer: Self-pay

## 2014-06-07 DIAGNOSIS — R197 Diarrhea, unspecified: Secondary | ICD-10-CM | POA: Insufficient documentation

## 2014-06-07 DIAGNOSIS — Z3202 Encounter for pregnancy test, result negative: Secondary | ICD-10-CM | POA: Insufficient documentation

## 2014-06-07 DIAGNOSIS — Z87440 Personal history of urinary (tract) infections: Secondary | ICD-10-CM | POA: Insufficient documentation

## 2014-06-07 DIAGNOSIS — N730 Acute parametritis and pelvic cellulitis: Secondary | ICD-10-CM | POA: Insufficient documentation

## 2014-06-07 DIAGNOSIS — Z872 Personal history of diseases of the skin and subcutaneous tissue: Secondary | ICD-10-CM | POA: Insufficient documentation

## 2014-06-07 DIAGNOSIS — Z79899 Other long term (current) drug therapy: Secondary | ICD-10-CM | POA: Insufficient documentation

## 2014-06-07 DIAGNOSIS — Z8639 Personal history of other endocrine, nutritional and metabolic disease: Secondary | ICD-10-CM | POA: Insufficient documentation

## 2014-06-07 HISTORY — DX: Urinary tract infection, site not specified: N39.0

## 2014-06-07 LAB — CBC WITH DIFFERENTIAL/PLATELET
BASOS ABS: 0 10*3/uL (ref 0.0–0.1)
Basophils Relative: 1 % (ref 0–1)
Eosinophils Absolute: 0 10*3/uL (ref 0.0–0.7)
Eosinophils Relative: 1 % (ref 0–5)
HEMATOCRIT: 33.8 % — AB (ref 36.0–46.0)
HEMOGLOBIN: 11.8 g/dL — AB (ref 12.0–15.0)
LYMPHS PCT: 54 % — AB (ref 12–46)
Lymphs Abs: 2.1 10*3/uL (ref 0.7–4.0)
MCH: 28.4 pg (ref 26.0–34.0)
MCHC: 34.9 g/dL (ref 30.0–36.0)
MCV: 81.3 fL (ref 78.0–100.0)
MONO ABS: 0.3 10*3/uL (ref 0.1–1.0)
MONOS PCT: 8 % (ref 3–12)
Neutro Abs: 1.4 10*3/uL — ABNORMAL LOW (ref 1.7–7.7)
Neutrophils Relative %: 36 % — ABNORMAL LOW (ref 43–77)
Platelets: 237 10*3/uL (ref 150–400)
RBC: 4.16 MIL/uL (ref 3.87–5.11)
RDW: 12.3 % (ref 11.5–15.5)
WBC: 3.8 10*3/uL — AB (ref 4.0–10.5)

## 2014-06-07 LAB — COMPREHENSIVE METABOLIC PANEL
ALT: 12 U/L (ref 0–35)
AST: 13 U/L (ref 0–37)
Albumin: 3.9 g/dL (ref 3.5–5.2)
Alkaline Phosphatase: 48 U/L (ref 39–117)
Anion gap: 10 (ref 5–15)
BILIRUBIN TOTAL: 0.4 mg/dL (ref 0.3–1.2)
BUN: 11 mg/dL (ref 6–23)
CHLORIDE: 102 meq/L (ref 96–112)
CO2: 29 mEq/L (ref 19–32)
CREATININE: 0.7 mg/dL (ref 0.50–1.10)
Calcium: 9.4 mg/dL (ref 8.4–10.5)
GFR calc Af Amer: >90 mL/min (ref >90)
GFR calc non Af Amer: >90 mL/min (ref >90)
Glucose, Bld: 88 mg/dL (ref 70–99)
Potassium: 3.6 mEq/L — ABNORMAL LOW (ref 3.7–5.3)
Sodium: 141 mEq/L (ref 137–147)
Total Protein: 6.7 g/dL (ref 6.0–8.3)

## 2014-06-07 LAB — WET PREP, GENITAL
Trich, Wet Prep: NONE SEEN
YEAST WET PREP: NONE SEEN

## 2014-06-07 LAB — URINALYSIS, ROUTINE W REFLEX MICROSCOPIC
Bilirubin Urine: NEGATIVE
GLUCOSE, UA: NEGATIVE mg/dL
HGB URINE DIPSTICK: NEGATIVE
Ketones, ur: NEGATIVE mg/dL
Leukocytes, UA: NEGATIVE
Nitrite: NEGATIVE
Protein, ur: NEGATIVE mg/dL
SPECIFIC GRAVITY, URINE: 1.014 (ref 1.005–1.030)
Urobilinogen, UA: 0.2 mg/dL (ref 0.0–1.0)
pH: 6 (ref 5.0–8.0)

## 2014-06-07 LAB — HIV ANTIBODY (ROUTINE TESTING W REFLEX): HIV 1&2 Ab, 4th Generation: NONREACTIVE

## 2014-06-07 LAB — LIPASE, BLOOD: LIPASE: 19 U/L (ref 11–59)

## 2014-06-07 LAB — PREGNANCY, URINE: PREG TEST UR: NEGATIVE

## 2014-06-07 MED ORDER — AZITHROMYCIN 250 MG PO TABS
1000.0000 mg | ORAL_TABLET | Freq: Once | ORAL | Status: AC
  Administered 2014-06-07: 1000 mg via ORAL
  Filled 2014-06-07: qty 4

## 2014-06-07 MED ORDER — IOHEXOL 300 MG/ML  SOLN
100.0000 mL | Freq: Once | INTRAMUSCULAR | Status: AC | PRN
  Administered 2014-06-07: 100 mL via INTRAVENOUS

## 2014-06-07 MED ORDER — DOXYCYCLINE HYCLATE 100 MG PO CAPS: 100.0000 mg | ORAL_CAPSULE | Freq: Two times a day (BID) | ORAL | Status: DC

## 2014-06-07 MED ORDER — CEFTRIAXONE SODIUM 250 MG IJ SOLR
INTRAMUSCULAR | Status: AC
  Filled 2014-06-07: qty 250

## 2014-06-07 MED ORDER — MORPHINE SULFATE 4 MG/ML IJ SOLN
4.0000 mg | Freq: Once | INTRAMUSCULAR | Status: AC
  Administered 2014-06-07: 4 mg via INTRAVENOUS
  Filled 2014-06-07: qty 1

## 2014-06-07 MED ORDER — CEFTRIAXONE SODIUM 250 MG IJ SOLR
250.0000 mg | Freq: Once | INTRAMUSCULAR | Status: AC
  Administered 2014-06-07: 250 mg via INTRAMUSCULAR
  Filled 2014-06-07: qty 250

## 2014-06-07 MED ORDER — LIDOCAINE HCL (PF) 1 % IJ SOLN
INTRAMUSCULAR | Status: AC
  Filled 2014-06-07: qty 5

## 2014-06-07 MED ORDER — ONDANSETRON HCL 4 MG/2ML IJ SOLN
4.0000 mg | Freq: Once | INTRAMUSCULAR | Status: AC
  Administered 2014-06-07: 4 mg via INTRAVENOUS
  Filled 2014-06-07: qty 2

## 2014-06-07 MED ORDER — AZITHROMYCIN 250 MG PO TABS
ORAL_TABLET | ORAL | Status: DC
Start: 2014-06-07 — End: 2014-06-07
  Filled 2014-06-07: qty 4

## 2014-06-07 NOTE — Discharge Instructions (Signed)
Pelvic Inflammatory Disease °Pelvic inflammatory disease (PID) refers to an infection in some or all of the female organs. The infection can be in the uterus, ovaries, fallopian tubes, or the surrounding tissues in the pelvis. PID can cause abdominal or pelvic pain that comes on suddenly (acute pelvic pain). PID is a serious infection because it can lead to lasting (chronic) pelvic pain or the inability to have children (infertile).  °CAUSES  °The infection is often caused by the normal bacteria found in the vaginal tissues. PID may also be caused by an infection that is spread during sexual contact. PID can also occur following:  °· The birth of a baby.   °· A miscarriage.   °· An abortion.   °· Major pelvic surgery.   °· The use of an intrauterine device (IUD).   °· A sexual assault.   °RISK FACTORS °Certain factors can put a person at higher risk for PID, such as: °· Being younger than 25 years. °· Being sexually active at a young age. °· Using nonbarrier contraception. °· Having multiple sexual partners. °· Having sex with someone who has symptoms of a genital infection. °· Using oral contraception. °Other times, certain behaviors can increase the possibility of getting PID, such as: °· Having sex during your period. °· Using a vaginal douche. °· Having an intrauterine device (IUD) in place. °SYMPTOMS  °· Abdominal or pelvic pain.   °· Fever.   °· Chills.   °· Abnormal vaginal discharge. °· Abnormal uterine bleeding.   °· Unusual pain shortly after finishing your period. °DIAGNOSIS  °Your caregiver will choose some of the following methods to make a diagnosis, such as:  °· Performing a physical exam and history. A pelvic exam typically reveals a very tender uterus and surrounding pelvis.   °· Ordering laboratory tests including a pregnancy test, blood tests, and urine test.  °· Ordering cultures of the vagina and cervix to check for a sexually transmitted infection (STI). °· Performing an ultrasound.    °· Performing a laparoscopic procedure to look inside the pelvis.   °TREATMENT  °· Antibiotic medicines may be prescribed and taken by mouth.   °· Sexual partners may be treated when the infection is caused by a sexually transmitted disease (STD).   °· Hospitalization may be needed to give antibiotics intravenously. °· Surgery may be needed, but this is rare. °It may take weeks until you are completely well. If you are diagnosed with PID, you should also be checked for human immunodeficiency virus (HIV).   °HOME CARE INSTRUCTIONS  °· If given, take your antibiotics as directed. Finish the medicine even if you start to feel better.   °· Only take over-the-counter or prescription medicines for pain, discomfort, or fever as directed by your caregiver.   °· Do not have sexual intercourse until treatment is completed or as directed by your caregiver. If PID is confirmed, your recent sexual partner(s) will need treatment.   °· Keep your follow-up appointments. °SEEK MEDICAL CARE IF:  °· You have increased or abnormal vaginal discharge.   °· You need prescription medicine for your pain.   °· You vomit.   °· You cannot take your medicines.   °· Your partner has an STD.   °SEEK IMMEDIATE MEDICAL CARE IF:  °· You have a fever.   °· You have increased abdominal or pelvic pain.   °· You have chills.   °· You have pain when you urinate.   °· You are not better after 72 hours following treatment.   °MAKE SURE YOU:  °· Understand these instructions. °· Will watch your condition. °· Will get help right away if you are not doing well or get worse. °  Document Released: 07/23/2005 Document Revised: 11/17/2012 Document Reviewed: 07/19/2011 °ExitCare® Patient Information ©2015 [employer]. This information is not intended to replace advice given to you by your health care provider. Make sure you discuss any questions you have with your health care provider. ° °

## 2014-06-07 NOTE — ED Provider Notes (Signed)
CSN: [id number]     Arrival date & time 06/07/14  1111 History   First MD Initiated Contact with Patient 06/07/14 1131     Chief Complaint  Patient presents with  . Pelvic Pain     (Consider location/radiation/quality/duration/timing/severity/associated sxs/prior Treatment) HPI Comments: Pt states that she has had pelvic pain and llq pain intermittent for the last 2 weeks that seems to be worsening. Diarrhea without vomiting. No fever. No more discharge then normal but used flagyl gel for bv type symptoms. No dysuria. No previous abdominal surgery.  The history is provided by the patient. No language interpreter was used.    Past Medical History  Diagnosis Date  . Goiter   . UTI (lower urinary tract infection)    Past Surgical History  Procedure Laterality Date  . Tonsillectomy    . Wisdom tooth extraction     No family history on file. History  Substance Use Topics  . Smoking status: Never Smoker   . Smokeless tobacco: Never Used  . Alcohol Use: No   OB History    No data available     Review of Systems  All other systems reviewed and are negative.     Allergies  Review of patient's allergies indicates no known allergies.  Home Medications   Prior to Admission medications   Medication Sig Start Date End Date Taking? Authorizing Provider  cephALEXin (KEFLEX) 500 MG capsule Take 1 capsule (500 mg total) by mouth 4 (four) times daily. 05/20/13   Kaitlyn Szekalski, PA-C  cephALEXin (KEFLEX) 500 MG capsule Take 1 capsule (500 mg total) by mouth 3 (three) times daily. 08/24/13   Tatyana A Kirichenko, PA-C  cephALEXin (KEFLEX) 500 MG capsule Take 1 capsule (500 mg total) by mouth 4 (four) times daily. 03/18/14   Elwin MochaBlair Walden, MD  ethynodiol-ethinyl estradiol Noni Saupe(KELNOR,ZOVIA) 1-35 MG-MCG tablet Take 1 tablet by mouth daily. 09/10/11 09/09/12  Forbes CellarLeigh-Ann Webb, MD  HYDROcodone-acetaminophen (NORCO/VICODIN) 5-325 MG per tablet Take 2 tablets by mouth every 4 (four) hours as needed.  10/14/13   Rolan BuccoMelanie Belfi, MD  HYDROcodone-acetaminophen (NORCO/VICODIN) 5-325 MG per tablet Take 1 tablet by mouth every 6 (six) hours as needed for moderate pain. 03/18/14   Elwin MochaBlair Walden, MD  metroNIDAZOLE (FLAGYL) 500 MG tablet Take 1 tablet (500 mg total) by mouth 2 (two) times daily. 08/24/13   Tatyana A Kirichenko, PA-C  metroNIDAZOLE (METROGEL VAGINAL) 0.75 % vaginal gel Place 1 Applicatorful vaginally 2 (two) times daily. 08/24/13   Tatyana A Kirichenko, PA-C  metroNIDAZOLE (METROGEL) 0.75 % vaginal gel Place 1 Applicatorful vaginally 2 (two) times daily. 03/18/14   Elwin MochaBlair Walden, MD  Norgestimate-Eth Estradiol (SPRINTEC 28 PO) Take by mouth.    Historical Provider, MD   BP 114/53 mmHg  Pulse 70  Temp(Src) 98.9 F (37.2 C) (Oral)  Resp 16  Ht 5\' 2"  (1.575 m)  Wt 135 lb (61.236 kg)  BMI 24.69 kg/m2  SpO2 99%  LMP 05/30/2014 Physical Exam  Constitutional: She is oriented to person, place, and time. She appears well-developed and well-nourished.  Cardiovascular: Normal rate and regular rhythm.   Pulmonary/Chest: Effort normal and breath sounds normal.  Abdominal: Soft. Bowel sounds are normal. There is tenderness in the left upper quadrant and left lower quadrant.  Genitourinary:  Thick yellow discharge.no cmt  Musculoskeletal: Normal range of motion.  Neurological: She is alert and oriented to person, place, and time. Coordination normal.  Skin: Skin is warm and dry.  Psychiatric: She has a normal mood and  affect.  Nursing note and vitals reviewed.   ED Course  Procedures (including critical care time) Labs Review Labs Reviewed  WET PREP, GENITAL - Abnormal; Notable for the following:    Clue Cells Wet Prep HPF POC FEW (*)    WBC, Wet Prep HPF POC MANY (*)    All other components within normal limits  CBC WITH DIFFERENTIAL - Abnormal; Notable for the following:    WBC 3.8 (*)    Hemoglobin 11.8 (*)    HCT 33.8 (*)    Neutrophils Relative % 36 (*)    Neutro Abs 1.4 (*)     Lymphocytes Relative 54 (*)    All other components within normal limits  COMPREHENSIVE METABOLIC PANEL - Abnormal; Notable for the following:    Potassium 3.6 (*)    All other components within normal limits  GC/CHLAMYDIA PROBE AMP  URINALYSIS, ROUTINE W REFLEX MICROSCOPIC  PREGNANCY, URINE  LIPASE, BLOOD  HIV ANTIBODY (ROUTINE TESTING)    Imaging Review Ct Abdomen Pelvis W Contrast  06/07/2014   CLINICAL DATA:  28 year old female with acute nausea vomiting and pelvic pain. Bilateral lower extremity pain. Initial encounter.  EXAM: CT ABDOMEN AND PELVIS WITH CONTRAST  TECHNIQUE: Multidetector CT imaging of the abdomen and pelvis was performed using the standard protocol following bolus administration of intravenous contrast.  CONTRAST:  100mL OMNIPAQUE IOHEXOL 300 MG/ML  SOLN  COMPARISON:  CT Abdomen and Pelvis 03/18/2014.  FINDINGS: Stable and negative lung bases.  No pericardial or pleural effusion.  No osseous abnormality identified.  No pelvic free fluid. Prominent parametrial soft tissues similar to the prior study. Diminutive bladder. Mildly redundant but otherwise negative distal colon. No inguinal or pelvic lymphadenopathy identified.  Negative left colon. Retained stool in the transverse colon and at both flexures. Negative right colon. Normal appendix seen on series 2, image 56. No dilated small bowel. Decompressed distal small bowel in the pelvis. Oral contrast has not reached the terminal ileum. Stomach is mildly distended with contrast, otherwise within normal limits. Negative duodenum.  Liver, gallbladder, spleen, pancreas, and adrenal glands are within normal limits. No abdominal free fluid. Portal venous system and major arterial structures in the abdomen and pelvis are patent. No abdominal lymphadenopathy identified.  Smaller right renal collecting system. Right lower pole simple appearing renal cyst appears stable. Negative kidneys otherwise.  IMPRESSION: 1. Normal appendix. No focal  inflammation identified in the abdomen. 2. Prominent and somewhat indistinct parametrial soft tissues. Consider pelvic inflammatory disease in this setting.   Electronically Signed   By: Augusto GambleLee  Hall M.D.   On: 06/07/2014 12:47     EKG Interpretation None      MDM   Final diagnoses:  PID (acute pelvic inflammatory disease)    Will treat for pid. Based on ct and exam. [facility]    Teressa LowerVrinda Javarion Douty, NP 06/07/14 1337

## 2014-06-07 NOTE — ED Notes (Signed)
C/o LLQ pain x 1 week-2 days ago pelvic pain started with lower back pain-positive n/v/d last week-"no more than normal" vaginal d/c

## 2014-06-08 LAB — GC/CHLAMYDIA PROBE AMP
CT Probe RNA: NEGATIVE
GC Probe RNA: NEGATIVE

## 2014-11-10 ENCOUNTER — Encounter (HOSPITAL_BASED_OUTPATIENT_CLINIC_OR_DEPARTMENT_OTHER): Payer: Self-pay

## 2014-11-10 ENCOUNTER — Emergency Department (HOSPITAL_BASED_OUTPATIENT_CLINIC_OR_DEPARTMENT_OTHER)
Admission: EM | Admit: 2014-11-10 | Discharge: 2014-11-10 | Disposition: A | Discharge status: Home / Self Care | Payer: Managed Care, Other (non HMO) | Attending: Emergency Medicine | Admitting: Emergency Medicine

## 2014-11-10 DIAGNOSIS — Z8639 Personal history of other endocrine, nutritional and metabolic disease: Secondary | ICD-10-CM | POA: Insufficient documentation

## 2014-11-10 DIAGNOSIS — Z79899 Other long term (current) drug therapy: Secondary | ICD-10-CM | POA: Insufficient documentation

## 2014-11-10 DIAGNOSIS — Z3202 Encounter for pregnancy test, result negative: Secondary | ICD-10-CM | POA: Insufficient documentation

## 2014-11-10 DIAGNOSIS — Z87440 Personal history of urinary (tract) infections: Secondary | ICD-10-CM | POA: Insufficient documentation

## 2014-11-10 DIAGNOSIS — N760 Acute vaginitis: Secondary | ICD-10-CM | POA: Insufficient documentation

## 2014-11-10 DIAGNOSIS — B9689 Other specified bacterial agents as the cause of diseases classified elsewhere: Secondary | ICD-10-CM

## 2014-11-10 HISTORY — DX: Other specified bacterial agents as the cause of diseases classified elsewhere: B96.89

## 2014-11-10 HISTORY — DX: Acute vaginitis: N76.0

## 2014-11-10 LAB — URINALYSIS, ROUTINE W REFLEX MICROSCOPIC
Bilirubin Urine: NEGATIVE
Glucose, UA: NEGATIVE mg/dL
Hgb urine dipstick: NEGATIVE
KETONES UR: NEGATIVE mg/dL
NITRITE: NEGATIVE
PH: 6 (ref 5.0–8.0)
Protein, ur: NEGATIVE mg/dL
SPECIFIC GRAVITY, URINE: 1.013 (ref 1.005–1.030)
Urobilinogen, UA: 0.2 mg/dL (ref 0.0–1.0)

## 2014-11-10 LAB — WET PREP, GENITAL
Trich, Wet Prep: NONE SEEN
YEAST WET PREP: NONE SEEN

## 2014-11-10 LAB — PREGNANCY, URINE: Preg Test, Ur: NEGATIVE

## 2014-11-10 LAB — URINE MICROSCOPIC-ADD ON

## 2014-11-10 MED ORDER — METRONIDAZOLE 500 MG PO TABS: 500.0000 mg | ORAL_TABLET | Freq: Two times a day (BID) | ORAL | Status: DC

## 2014-11-10 MED ORDER — FLUCONAZOLE 50 MG PO TABS
150.0000 mg | ORAL_TABLET | Freq: Once | ORAL | Status: AC
  Administered 2014-11-10: 150 mg via ORAL
  Filled 2014-11-10 (×2): qty 1

## 2014-11-10 NOTE — ED Notes (Signed)
Pa  at bedside. 

## 2014-11-10 NOTE — ED Notes (Signed)
Vaginal d/c x 2 days, dysuria x 2 weeks

## 2014-11-10 NOTE — ED Notes (Signed)
Pelvic cart at bedside. 

## 2014-11-10 NOTE — Discharge Instructions (Signed)
Bacterial Vaginosis Bacterial vaginosis is a vaginal infection that occurs when the normal balance of bacteria in the vagina is disrupted. It results from an overgrowth of certain bacteria. This is the most common vaginal infection in women of childbearing age. Treatment is important to prevent complications, especially in pregnant women, as it can cause a premature delivery. CAUSES  Bacterial vaginosis is caused by an increase in harmful bacteria that are normally present in smaller amounts in the vagina. Several different kinds of bacteria can cause bacterial vaginosis. However, the reason that the condition develops is not fully understood. RISK FACTORS Certain activities or behaviors can put you at an increased risk of developing bacterial vaginosis, including:  Having a new sex partner or multiple sex partners.  Douching.  Using an intrauterine device (IUD) for contraception. Women do not get bacterial vaginosis from toilet seats, bedding, swimming pools, or contact with objects around them. SIGNS AND SYMPTOMS  Some women with bacterial vaginosis have no signs or symptoms. Common symptoms include:  Grey vaginal discharge.  A fishlike odor with discharge, especially after sexual intercourse.  Itching or burning of the vagina and vulva.  Burning or pain with urination. DIAGNOSIS  Your health care provider will take a medical history and examine the vagina for signs of bacterial vaginosis. A sample of vaginal fluid may be taken. Your health care provider will look at this sample under a microscope to check for bacteria and abnormal cells. A vaginal pH test may also be done.  TREATMENT  Bacterial vaginosis may be treated with antibiotic medicines. These may be given in the form of a pill or a vaginal cream. A second round of antibiotics may be prescribed if the condition comes back after treatment.  HOME CARE INSTRUCTIONS   Only take over-the-counter or prescription medicines as  directed by your health care provider.  If antibiotic medicine was prescribed, take it as directed. Make sure you finish it even if you start to feel better.  Do not have sex until treatment is completed.  Tell all sexual partners that you have a vaginal infection. They should see their health care provider and be treated if they have problems, such as a mild rash or itching.  Practice safe sex by using condoms and only having one sex partner. SEEK MEDICAL CARE IF:   Your symptoms are not improving after 3 days of treatment.  You have increased discharge or pain.  You have a fever. MAKE SURE YOU:   Understand these instructions.  Will watch your condition.  Will get help right away if you are not doing well or get worse. FOR MORE INFORMATION  Centers for Disease Control and Prevention, Division of STD Prevention: SolutionApps.co.za American Sexual Health Association (ASHA): www.ashastd.org  Document Released: 07/23/2005 Document Revised: 05/13/2013 Document Reviewed: 03/04/2013 [facility] Patient Information 2015 [place], [place]. This information is not intended to replace advice given to you by your health care provider. Make sure you discuss any questions you have with your health care provider.  Please take your medications as directed. You'll also need follow-up with primary care for further evaluation and management of your symptoms. Please refer to resource guide. Return to ED for new or worsening symptoms.  Emergency Department Resource Guide 1) Find a Doctor and Pay Out of Pocket Although you won't have to find out who is covered by your insurance plan, it is a good idea to ask around and get recommendations. You will then need to call the office and see if  the doctor you have chosen will accept you as a new patient and what types of options they offer for patients who are self-pay. Some doctors offer discounts or will set up payment plans for their patients who do not have  insurance, but you will need to ask so you aren't surprised when you get to your appointment.  2) Contact Your Local Health Department Not all health departments have doctors that can see patients for sick visits, but many do, so it is worth a call to see if yours does. If you don't know where your local health department is, you can check in your phone book. The CDC also has a tool to help you locate your state's health department, and many state websites also have listings of all of their local health departments.  3) Find a Walk-in Clinic If your illness is not likely to be very severe or complicated, you may want to try a walk in clinic. These are popping up all over the country in pharmacies, drugstores, and shopping centers. They're usually staffed by nurse practitioners or physician assistants that have been trained to treat common illnesses and complaints. They're usually fairly quick and inexpensive. However, if you have serious medical issues or chronic medical problems, these are probably not your best option.  No Primary Care Doctor: - Call Health Connect at  [phone] - they can help you locate a primary care doctor that  accepts your insurance, provides certain services, etc. - Physician Referral Service- [phone]  Chronic Pain Problems: Organization         Address  Phone   Notes  [facility]  [phone]) [phone] Patients need to be referred by their primary care doctor.   Medication Assistance: Organization         Address  Phone   Notes  [facility] [address] [place], [place] [address] [phone] --Must be a resident of [place] [facility] -- Must have NO insurance coverage whatsoever (no Medicaid/ Medicare, etc.) -- The pt. MUST have a primary care doctor that directs their care regularly and follows them in the community   MedAssist  [phone]) [phone]   [employer]  [phone]) [phone]    Agencies that provide  inexpensive medical care: Organization         Address  Phone   Notes  [facility]  [phone]   [facility]    [phone]   [facility] [address] [place], [place] [address] [phone]   [facility] [address], [place] [phone]) [phone]   [facility]anned Parenthood    [phone]   [facility]    [phone]) [phone]   [facility]  [address], [place] Phone:  [phone], Fax:  [phone] Hours of Operation:  9 am - 6 pm, M-F.  Also accepts Medicaid/Medicare and self-pay.  [facility]  [address], [place] Phone: [phone]) [phone], Fax: [phone]) [phone]. Hours of Operation:  8:30 am - 5:30 pm, M-F.  Also accepts Medicaid and self-pay.  [facility] [place] [address], [place] Phone: [phone]) [phone]   [facility] [address], [place], [place] [phone], Ext. [phone] Mondays & Thursdays: 7-9 AM.  First 15 patients are seen on a first come, first serve basis.    Medicaid-accepting [facility] Providers:  Organization         Address  Phone   Notes  [facility] [address], [place] [phone] Also accepts self-pay patients.  [facility] [address] Laurell Josephs [place], [place]  [phone]   [facility] [address], [place] [phone]   [facility] [address] [place] [phone]   Renaye Rakers [address], [place]   [phone] Only accepts [place] Access [place] patients after they have their name applied to their card.   Self-Pay (no insurance) in [place]:  Organization         Address  Phone   Notes  Sickle Cell Patients, [facility] [address] [place], [place] [phone]   [facility] [address] [place], [place] [phone]   [facility] [place]  [address], [place] [phone]   [facility]/Dr. Osei-Bonsu  [address] [place] or [address], [place] [phone] Phone number for both [place] and [place] locations is the same.  [facility] [address] [place] [phone]   [facility] [address] [place] or [address] [phone]   [facility] [address] [place] [phone], phone; [phone], fax Sees patients 1st and 3rd Saturday of every month.  Must not qualify for public or private insurance (i.e. Medicaid, Medicare, [place] Health Choice, Veterans' Benefits)  Household income should be no more than 200% of the poverty level The clinic cannot treat you if you are pregnant or think you are pregnant  Sexually transmitted diseases are not treated at the clinic.    Dental Care: Organization         Address  Phone  Notes  [facility] Department of [facility] [address] [place], [place] [phone] Accepts children up to age 36 who are enrolled in [place] or [place] Health Choice; pregnant women with a Medicaid card; and children who have applied for Medicaid or [place] Health Choice, but were declined, whose parents can pay a reduced fee at time of service.  [facility] Department of [facility]  [address], [place] [phone] Accepts children up to age 54 who are enrolled in [place] or [place] Health Choice; pregnant women with a Medicaid card; and children who have applied for Medicaid or [place] Health Choice, but were declined, whose parents can pay a reduced fee at time of service.  [facility]  [address] [place], [place] [phone] Patients are seen by appointment only. Walk-ins are  not accepted. [facility] will see patients 56 years of age and older. Monday - Tuesday (8am-5pm) Most Wednesdays (8:30-5pm) $30 per visit, cash only  [facility] Access PRO[facility]  [address], [facility] [phone] Patients are seen by appointment only. Walk-ins are not accepted. [facility] will see patients 32 years of age and older. One Wednesday Evening (Monthly: Volunteer Based).  $30 per visit, cash only  [employer]  [phone] for adults; Children under age 3, call Graduate Pediatric Dentistry at [phone]. Children aged 10-14, please call [phone] to request a pediatric application.  Dental services  are provided in all areas of dental care including fillings, crowns and bridges, complete and partial dentures, implants, gum treatment, root canals, and extractions. Preventive care is also provided. Treatment is provided to both adults and children. Patients are selected via a lottery and there is often a waiting list.   [facility] [address], [place]  [phone] [email or web address]   [facility] [address] [place], [place] [phone], Ext. [phone] Second and Fourth Thursday of each month, opens at 6:30 AM; Clinic ends at 9 AM.  Patients are seen on a first-come first-served basis, and a limited number are seen during each clinic.   [facility]  [address] Ether Griffins [place], [place] [phone]   Eligibility Requirements You must have lived in [place], [place], or [place] counties for at least the last three months.   You cannot be eligible for state or federal sponsored [place], including [employer], [place], or [employer].   You generally cannot be eligible for healthcare insurance through your employer.    How to apply: Eligibility screenings are held every Tuesday and Wednesday afternoon from 1:00 pm until 4:00 pm. You do not need an appointment for  the interview!  [facility] [address] [place], [place] [phone]   [facility] Health Department  [phone]   [facility] Health Department  [phone]   [facility] Health Department  [phone]    Behavioral Health Resources in the Community: Intensive Outpatient Programs Organization         Address  Phone  Notes  [facility] [address], [place], [place] [phone]   [facility] Outpatient [address] [place], [place] [phone]   [facility] [address], [place], [place]  [phone]   [facility] [address],  [place], [place] [phone] or [phone]   Substance Abuse Resources Organization         Address  Phone  Notes  [facility] Drug Services  [phone]   [facility]  [phone]   [facility]  [phone]   Floydene Flock  [phone]   Residential & Outpatient Substance Abuse Program  [phone]   Psychological Services Organization         Address  Phone  Notes  [facility] Behavioral Health  [phone]   [facility] Services  [phone]   [facility] [address], [place] [phone] or [phone]    Mobile Crisis Teams Organization         Address  Phone  Notes  [facility], Mobile Crisis Care Unit  [phone]   [facility]  [address] [place], [place] [phone]   Doristine Locks [address] [place] [phone]    Self-Help/Support Groups Organization         Address  Phone             Notes  [facility] [place] - variety of support groups  [phone]- [phone] Call for more information  Narcotics Anonymous (NA), [facility] [address], [employer] [place]  2 meetings at this location   Statistician         Address  Phone  Notes  [facility]  [address],    [place]  [phone]   [facility]  [address], [place] [address], [place], [place] [phone]  [place], [place] [phone] Admissions: 8am-3pm M-F  Incentives Substance [place] [address]    [place], [place] [phone]   [facility] [address] [place], [place], [place]   [facility] [address],  [place], [place]   [facility] - Intensive Outpatient [address], [place], [place]   [facility] [address]  [place], [place] [phone] or [phone]   [facility] ([facility]) [address] [place], [place] Accepts Medicaid  [facility] [place] [address],  [place] [phone] Substance Abuse/Addiction Treatment   [facility] Organization         Address  Phone  Notes  [facility]  [phone]   Domenic Schwab, PhD [address] [place], [place]   [phone] or [phone]   [place], [place] [phone]   [facility] [address] [place], [place] [phone] Insurance/Medicaid/sponsorship through [facility] and Families [address]                                    [place], [place] [phone] [place] [address] [place], [place] [phone]    Dr. Adele Schilder  [phone]   [facility] Dept. 1) [address], [place] 2) [place] 3)  [address], [place] [phone]  [phone]   [place] [phone] or [phone] (After Hours)

## 2014-11-10 NOTE — ED Provider Notes (Signed)
CSN: [id number]     Arrival date & time 11/10/14  1300 History   First MD Initiated Contact with Patient 11/10/14 1641     Chief Complaint  Patient presents with  . Vaginal Discharge     (Consider location/radiation/quality/duration/timing/severity/associated sxs/prior Treatment) HPI Janice Graham is a 29 y.o. female With recurrent UTI because in for evaluation of dysuria and vaginal discharge. Patient states approximately one month ago she had unprotected sex with her ex and has started to experience a vaginal discharge with an odor approximately 4 days ago. She reports having been in the past but says this seems a little bit different and wants to get checked out. She denies any fevers, nausea or vomiting, abdominal pain, back pain, dizziness, shortness of breath, headache. She tried taking one leftover pill of metronidazole for her discharge but that did not help. She is also concerned she may have a UTI as she is experiencing dysuria and hesitancy for the past 4 days.  Past Medical History  Diagnosis Date  . Goiter   . UTI (lower urinary tract infection)   . BV (bacterial vaginosis)    Past Surgical History  Procedure Laterality Date  . Tonsillectomy    . Wisdom tooth extraction     No family history on file. History  Substance Use Topics  . Smoking status: Never Smoker   . Smokeless tobacco: Never Used  . Alcohol Use: No   OB History    No data available     Review of Systems A 10 point review of systems was completed and was negative except for pertinent positives and negatives as mentioned in the history of present illness     Allergies  Review of patient's allergies indicates no known allergies.  Home Medications   Prior to Admission medications   Medication Sig Start Date End Date Taking? Authorizing Provider  ethynodiol-ethinyl estradiol (KELNOR,ZOVIA) 1-35 MG-MCG tablet Take 1 tablet by mouth daily. 09/10/11 09/09/12  Forbes Cellar, MD  metroNIDAZOLE (FLAGYL) 500  MG tablet Take 1 tablet (500 mg total) by mouth 2 (two) times daily. 11/10/14   Joycie Peek, PA-C   BP 123/74 mmHg  Pulse 62  Temp(Src) 98.9 F (37.2 C) (Oral)  Resp 16  Ht  (1.575 m)  Wt 132 lb (59.875 kg)  BMI 24.14 kg/m2  SpO2 100%  LMP 10/09/2014 Physical Exam  Constitutional: She is oriented to person, place, and time. She appears well-developed and well-nourished.  HENT:  Head: Normocephalic and atraumatic.  Mouth/Throat: Oropharynx is clear and moist.  Eyes: Conjunctivae are normal. Pupils are equal, round, and reactive to light. Right eye exhibits no discharge. Left eye exhibits no discharge. No scleral icterus.  Neck: Neck supple.  Cardiovascular: Normal rate, regular rhythm and normal heart sounds.   Pulmonary/Chest: Effort normal and breath sounds normal. No respiratory distress. She has no wheezes. She has no rales.  Abdominal: Soft. There is no tenderness.  Genitourinary:  Chaperone was present for the entire genital exam. No lesions or rashes appreciated on vulva. Cervix visualized on speculum exam and appropriate cultures sampled. Scant blood in vaginal vault. Discharge: Curdy white discharge. Upon bi manual exam- No TTP of the adnexa, no cervical motion tenderness. No fullness or masses appreciated. No abnormalities appreciated in structural anatomy.   Musculoskeletal: She exhibits no tenderness.  Neurological: She is alert and oriented to person, place, and time.  Cranial Nerves II-XII grossly intact  Skin: Skin is warm and dry. No rash noted.  Psychiatric: She  has a normal mood and affect.  Nursing note and vitals reviewed.   ED Course  Procedures (including critical care time) Labs Review Labs Reviewed  WET PREP, GENITAL - Abnormal; Notable for the following:    Clue Cells Wet Prep HPF POC TOO NUMEROUS TO COUNT (*)    WBC, Wet Prep HPF POC MODERATE (*)    All other components within normal limits  URINALYSIS, ROUTINE W REFLEX MICROSCOPIC -  Abnormal; Notable for the following:    APPearance CLOUDY (*)    Leukocytes, UA SMALL (*)    All other components within normal limits  URINE MICROSCOPIC-ADD ON - Abnormal; Notable for the following:    Squamous Epithelial / LPF FEW (*)    Bacteria, UA MANY (*)    All other components within normal limits  URINE CULTURE  PREGNANCY, URINE  HIV ANTIBODY (ROUTINE TESTING)  GC/CHLAMYDIA PROBE AMP ([place])    Imaging Review No results found.   EKG Interpretation None     Meds given in ED:  Medications  fluconazole (DIFLUCAN) tablet 150 mg (150 mg Oral Given 11/10/14 1833)    Discharge Medication List as of 11/10/2014  6:16 PM    START taking these medications   Details  metroNIDAZOLE (FLAGYL) 500 MG tablet Take 1 tablet (500 mg total) by mouth 2 (two) times daily., Starting 11/10/2014, Until Discontinued, Print       Filed Vitals:   11/10/14 1311 11/10/14 1826  BP: 114/72 123/74  Pulse: 63 62  Temp: 99.5 F (37.5 C) 98.9 F (37.2 C)  TempSrc: Oral Oral  Resp: 20 16  Height: 5\' 2"  (1.575 m)   Weight: 132 lb (59.875 kg)   SpO2: 100% 100%    MDM  Vitals stable - WNL -afebrile Pt resting comfortably in ED. PE--Patient clinically with yeast infection. Otherwise benign pelvic/abdominal exam. Labwork--multiple clue cells, suggestive of BV. No evidence of UTI. Suspect dirty catch, however will obtain culture.  DDX--Will treat BV with metronidazole.Diflucan in ED. No evidence of other acute or emergent pathology at this time. No evidence of PID, ectopic, torsion.  I discussed all relevant lab findings and imaging results with pt and they verbalized understanding. Discussed f/u with PCP within 48 hrs and return precautions, pt very amenable to plan.  Final diagnoses:  BV (bacterial vaginosis)        Joycie PeekBenjamin Haileyann Staiger, PA-C 11/10/14 [id number]  Glynn OctaveStephen Rancour, MD 11/10/14 [phone]

## 2014-11-10 NOTE — ED Notes (Signed)
Pt requesting flagyl gel and abx for UTI

## 2014-11-11 LAB — GC/CHLAMYDIA PROBE AMP (~~LOC~~) NOT AT ARMC
Chlamydia: NEGATIVE
Neisseria Gonorrhea: NEGATIVE

## 2014-11-12 LAB — HIV ANTIBODY (ROUTINE TESTING W REFLEX): HIV Screen 4th Generation wRfx: NONREACTIVE

## 2014-11-13 LAB — URINE CULTURE
Colony Count: >=100000
SPECIAL REQUESTS: NORMAL

## 2014-11-14 NOTE — Progress Notes (Signed)
ED Antimicrobial Stewardship Positive Culture Follow Up   Janice Graham is an 29 y.o. female who [facility] with a chief complaint of  Chief Complaint  Patient presents with  . Vaginal Discharge    Recent Results (from the past 720 hour(s))  Urine culture     Status: None   Collection Time: 11/10/14  1:00 PM  Result Value Ref Range Status   Specimen Description URINE, CLEAN CATCH  Final   Special Requests Normal  Final   Colony Count   Final    >=100,000 COLONIES/ML Performed at [facility]    Culture   Final    KLEBSIELLA PNEUMONIAE Performed at [facility]    Report Status 11/13/2014 FINAL  Final   Organism ID, Bacteria KLEBSIELLA PNEUMONIAE  Final      Susceptibility   Klebsiella pneumoniae - MIC*    AMPICILLIN RESISTANT      CEFAZOLIN <=4 SENSITIVE Sensitive     CEFTRIAXONE <=1 SENSITIVE Sensitive     CIPROFLOXACIN <=0.25 SENSITIVE Sensitive     GENTAMICIN <=1 SENSITIVE Sensitive     LEVOFLOXACIN <=0.12 SENSITIVE Sensitive     NITROFURANTOIN 64 INTERMEDIATE Intermediate     TOBRAMYCIN <=1 SENSITIVE Sensitive     TRIMETH/SULFA <=20 SENSITIVE Sensitive     PIP/TAZO <=4 SENSITIVE Sensitive     * KLEBSIELLA PNEUMONIAE  Wet prep, genital     Status: Abnormal   Collection Time: 11/10/14  5:40 PM  Result Value Ref Range Status   Yeast Wet Prep HPF POC NONE SEEN NONE SEEN Final   Trich, Wet Prep NONE SEEN NONE SEEN Final   Clue Cells Wet Prep HPF POC TOO NUMEROUS TO COUNT (A) NONE SEEN Final   WBC, Wet Prep HPF POC MODERATE (A) NONE SEEN Final    [x]  Patient discharged originally without antimicrobial agent and treatment is now indicated  New antibiotic prescription: Continue Flagyl as prescribed and add Bactrim DS 1 tab bid x 3 days  ED Provider: Raymon MuttonMarissa Sciacca, PA-C   Janice Graham, Janice Graham 11/14/2014, 2:45 PM Infectious Diseases Pharmacist Phone# [phone]

## 2014-11-15 ENCOUNTER — Telehealth (HOSPITAL_COMMUNITY): Payer: Self-pay | Admitting: *Deleted

## 2014-11-24 ENCOUNTER — Encounter (HOSPITAL_BASED_OUTPATIENT_CLINIC_OR_DEPARTMENT_OTHER): Payer: Self-pay | Admitting: *Deleted

## 2014-11-24 ENCOUNTER — Emergency Department (HOSPITAL_BASED_OUTPATIENT_CLINIC_OR_DEPARTMENT_OTHER)
Admission: EM | Admit: 2014-11-24 | Discharge: 2014-11-24 | Disposition: A | Discharge status: Home / Self Care | Payer: Managed Care, Other (non HMO) | Attending: Emergency Medicine | Admitting: Emergency Medicine

## 2014-11-24 DIAGNOSIS — Z792 Long term (current) use of antibiotics: Secondary | ICD-10-CM | POA: Insufficient documentation

## 2014-11-24 DIAGNOSIS — R5383 Other fatigue: Secondary | ICD-10-CM | POA: Insufficient documentation

## 2014-11-24 DIAGNOSIS — Z8619 Personal history of other infectious and parasitic diseases: Secondary | ICD-10-CM | POA: Insufficient documentation

## 2014-11-24 DIAGNOSIS — Z87440 Personal history of urinary (tract) infections: Secondary | ICD-10-CM | POA: Insufficient documentation

## 2014-11-24 DIAGNOSIS — Z8742 Personal history of other diseases of the female genital tract: Secondary | ICD-10-CM | POA: Insufficient documentation

## 2014-11-24 DIAGNOSIS — M791 Myalgia, unspecified site: Secondary | ICD-10-CM

## 2014-11-24 DIAGNOSIS — Z8639 Personal history of other endocrine, nutritional and metabolic disease: Secondary | ICD-10-CM | POA: Insufficient documentation

## 2014-11-24 DIAGNOSIS — Z793 Long term (current) use of hormonal contraceptives: Secondary | ICD-10-CM | POA: Insufficient documentation

## 2014-11-24 DIAGNOSIS — R6889 Other general symptoms and signs: Secondary | ICD-10-CM

## 2014-11-24 DIAGNOSIS — J029 Acute pharyngitis, unspecified: Secondary | ICD-10-CM

## 2014-11-24 MED ORDER — AMOXICILLIN 500 MG PO CAPS: 500.0000 mg | ORAL_CAPSULE | Freq: Three times a day (TID) | ORAL | Status: DC

## 2014-11-24 MED ORDER — OSELTAMIVIR PHOSPHATE 75 MG PO CAPS: 75.0000 mg | ORAL_CAPSULE | Freq: Two times a day (BID) | ORAL | Status: DC

## 2014-11-24 NOTE — Discharge Instructions (Signed)

## 2014-11-24 NOTE — ED Notes (Signed)
Pt c/o URI symptoms x 2 days 

## 2014-11-24 NOTE — ED Provider Notes (Signed)
CSN: [id number]     Arrival date & time 11/24/14  1940 History   First MD Initiated Contact with Patient 11/24/14 2026     Chief Complaint  Patient presents with  . URI     (Consider location/radiation/quality/duration/timing/severity/associated sxs/prior Treatment) Patient is a 28 y.o. female presenting with pharyngitis. The history is provided by the patient. No language interpreter was used.  Sore Throat This is a new problem. The current episode started yesterday. The problem occurs constantly. The problem has been gradually worsening. Associated symptoms include arthralgias, chills, congestion, fatigue, myalgias and a sore throat. Pertinent negatives include no abdominal pain, anorexia, change in bowel habit, chest pain, coughing, diaphoresis, fever, headaches, joint swelling, nausea, neck pain, numbness, rash, swollen glands, urinary symptoms, vertigo, visual change, vomiting or weakness. The symptoms are aggravated by swallowing. She has tried nothing for the symptoms. The treatment provided no relief.    Past Medical History  Diagnosis Date  . Goiter   . UTI (lower urinary tract infection)   . BV (bacterial vaginosis)    Past Surgical History  Procedure Laterality Date  . Tonsillectomy    . Wisdom tooth extraction     History reviewed. No pertinent family history. History  Substance Use Topics  . Smoking status: Never Smoker   . Smokeless tobacco: Never Used  . Alcohol Use: No   OB History    No data available     Review of Systems  Constitutional: Positive for chills and fatigue. Negative for fever and diaphoresis.  HENT: Positive for congestion and sore throat.   Respiratory: Negative for cough.   Cardiovascular: Negative for chest pain.  Gastrointestinal: Negative for nausea, vomiting, abdominal pain, anorexia and change in bowel habit.  Musculoskeletal: Positive for myalgias and arthralgias. Negative for joint swelling and neck pain.  Skin: Negative for rash.   Neurological: Negative for vertigo, weakness, numbness and headaches.  All other systems reviewed and are negative.     Allergies  Review of patient's allergies indicates no known allergies.  Home Medications   Prior to Admission medications   Medication Sig Start Date End Date Taking? Authorizing Provider  ethynodiol-ethinyl estradiol (KELNOR,ZOVIA) 1-35 MG-MCG tablet Take 1 tablet by mouth daily. 09/10/11 09/09/12  Forbes Cellar, MD  metroNIDAZOLE (FLAGYL) 500 MG tablet Take 1 tablet (500 mg total) by mouth 2 (two) times daily. 11/10/14   Joycie Peek, PA-C   BP 124/78 mmHg  Pulse 98  Temp(Src) 99.2 F (37.3 C) (Oral)  Resp 18  Ht  (1.575 m)  Wt 132 lb (59.875 kg)  BMI 24.14 kg/m2  SpO2 100%  LMP 11/07/2014 Physical Exam  Constitutional: She is oriented to person, place, and time. She appears well-developed and well-nourished. No distress.  HENT:  Head: Normocephalic and atraumatic.  Mouth/Throat: Uvula is midline. Oropharyngeal exudate, posterior oropharyngeal edema and posterior oropharyngeal erythema present. No tonsillar abscesses.  Eyes: Conjunctivae are normal. No scleral icterus.  Neck: Normal range of motion.  Cardiovascular: Normal rate, regular rhythm and normal heart sounds.  Exam reveals no gallop and no friction rub.   No murmur heard. Pulmonary/Chest: Effort normal and breath sounds normal. No respiratory distress.  Abdominal: Soft. Bowel sounds are normal. She exhibits no distension and no mass. There is no tenderness. There is no guarding.  Neurological: She is alert and oriented to person, place, and time.  Skin: Skin is warm and dry. She is not diaphoretic.    ED Course  Procedures (including critical care time) Labs Review  Labs Reviewed - No data to display  Imaging Review No results found.   EKG Interpretation None      MDM   Final diagnoses:  None    Patient with myalgias, sore throat, exposure to influenza last week. Patient  will be given Tamiflu, amoxicillin. Use OTC pain meds such as NyQuil and DayQuil. Cor: With liquid Benadryl for 3 relief. He is safe for discharge at this time    Arthor Captainbigail Tillie Viverette, PA-C 11/25/14 1812  Tilden FossaElizabeth Rees, MD 11/25/14 2249

## 2015-02-27 ENCOUNTER — Encounter (HOSPITAL_BASED_OUTPATIENT_CLINIC_OR_DEPARTMENT_OTHER): Payer: Self-pay | Admitting: *Deleted

## 2015-02-27 ENCOUNTER — Emergency Department (HOSPITAL_BASED_OUTPATIENT_CLINIC_OR_DEPARTMENT_OTHER)
Admission: EM | Admit: 2015-02-27 | Discharge: 2015-02-27 | Disposition: A | Discharge status: Home / Self Care | Payer: Managed Care, Other (non HMO) | Attending: Emergency Medicine | Admitting: Emergency Medicine

## 2015-02-27 DIAGNOSIS — B9689 Other specified bacterial agents as the cause of diseases classified elsewhere: Secondary | ICD-10-CM

## 2015-02-27 DIAGNOSIS — N390 Urinary tract infection, site not specified: Secondary | ICD-10-CM

## 2015-02-27 DIAGNOSIS — Z793 Long term (current) use of hormonal contraceptives: Secondary | ICD-10-CM | POA: Insufficient documentation

## 2015-02-27 DIAGNOSIS — Z3202 Encounter for pregnancy test, result negative: Secondary | ICD-10-CM | POA: Insufficient documentation

## 2015-02-27 DIAGNOSIS — N760 Acute vaginitis: Secondary | ICD-10-CM | POA: Insufficient documentation

## 2015-02-27 DIAGNOSIS — Z8639 Personal history of other endocrine, nutritional and metabolic disease: Secondary | ICD-10-CM | POA: Insufficient documentation

## 2015-02-27 LAB — WET PREP, GENITAL
Trich, Wet Prep: NONE SEEN
Yeast Wet Prep HPF POC: NONE SEEN

## 2015-02-27 LAB — URINALYSIS, ROUTINE W REFLEX MICROSCOPIC
Bilirubin Urine: NEGATIVE
GLUCOSE, UA: NEGATIVE mg/dL
HGB URINE DIPSTICK: NEGATIVE
KETONES UR: NEGATIVE mg/dL
Nitrite: POSITIVE — AB
PROTEIN: NEGATIVE mg/dL
Specific Gravity, Urine: 1.013 (ref 1.005–1.030)
Urobilinogen, UA: 0.2 mg/dL (ref 0.0–1.0)
pH: 6.5 (ref 5.0–8.0)

## 2015-02-27 LAB — URINE MICROSCOPIC-ADD ON

## 2015-02-27 LAB — PREGNANCY, URINE: PREG TEST UR: NEGATIVE

## 2015-02-27 MED ORDER — IBUPROFEN 800 MG PO TABS
800.0000 mg | ORAL_TABLET | Freq: Once | ORAL | Status: AC
  Administered 2015-02-27: 800 mg via ORAL
  Filled 2015-02-27: qty 1

## 2015-02-27 MED ORDER — METRONIDAZOLE 500 MG PO TABS: 500.0000 mg | ORAL_TABLET | Freq: Two times a day (BID) | ORAL | Status: DC

## 2015-02-27 MED ORDER — CEPHALEXIN 500 MG PO CAPS: 500.0000 mg | ORAL_CAPSULE | Freq: Three times a day (TID) | ORAL | Status: DC

## 2015-02-27 NOTE — ED Provider Notes (Signed)
CSN: [id number]     Arrival date & time 02/27/15  1357 History  This chart was scribed for Jerelyn Scott, MD by Abel Presto, ED Scribe. This patient was seen in room MH03/MH03 and the patient's care was started at 4:13 PM.    Chief Complaint  Patient presents with  . Dysuria    Patient is a 29 y.o. female presenting with dysuria. The history is provided by the patient. No language interpreter was used.  Dysuria Pain quality:  Unable to specify Pain severity:  Mild Onset quality:  Gradual Duration:  1 month Timing:  Constant Progression:  Unchanged Chronicity:  Recurrent Recent urinary tract infections: yes   Relieved by:  None tried Worsened by:  Nothing tried Ineffective treatments:  None tried Urinary symptoms: no discolored urine   Associated symptoms: nausea   Associated symptoms: no fever and no vomiting   Risk factors: recurrent urinary tract infections    HPI Comments: Janice Graham is a 29 y.o. female who presents to the Emergency Department complaining of dysuria with onset 1 month ago. Pt notes assocaited urgency, urinary retention, nausea, and right sided pelvic pain. She reports h/o recurrent UTIs, and bacterial vaginosis last treated for BV with metronidazole and diflucan in 11/2014. An Rx for Bactrim was called in on 11/15/14 following urine culture results. Pt would also liked to be treated for BV at this time. She denies fever, chills, and vomiting.   Past Medical History  Diagnosis Date  . Goiter   . UTI (lower urinary tract infection)   . BV (bacterial vaginosis)    Past Surgical History  Procedure Laterality Date  . Tonsillectomy    . Wisdom tooth extraction     History reviewed. No pertinent family history. History  Substance Use Topics  . Smoking status: Never Smoker   . Smokeless tobacco: Never Used  . Alcohol Use: No   OB History    No data available     Review of Systems  Constitutional: Negative for fever and chills.  Gastrointestinal:  Positive for nausea. Negative for vomiting.  Genitourinary: Positive for dysuria, urgency, decreased urine volume, difficulty urinating and pelvic pain.  All other systems reviewed and are negative.     Allergies  Review of patient's allergies indicates no known allergies.  Home Medications   Prior to Admission medications   Medication Sig Start Date End Date Taking? Authorizing Provider  cephALEXin (KEFLEX) 500 MG capsule Take 1 capsule (500 mg total) by mouth 3 (three) times daily. 02/27/15   Jerelyn Scott, MD  ethynodiol-ethinyl estradiol Noni Saupe) 1-35 MG-MCG tablet Take 1 tablet by mouth daily. 09/10/11 09/09/12  Forbes Cellar, MD  metroNIDAZOLE (FLAGYL) 500 MG tablet Take 1 tablet (500 mg total) by mouth 2 (two) times daily. 02/27/15   Jerelyn Scott, MD  oseltamivir (TAMIFLU) 75 MG capsule Take 1 capsule (75 mg total) by mouth every 12 (twelve) hours. 11/24/14   Abigail Harris, PA-C   BP 107/77 mmHg  Pulse 69  Temp(Src) 98.6 F (37 C) (Oral)  Resp 16  Ht 5\' 2"  (1.575 m)  Wt 128 lb (58.06 kg)  BMI 23.41 kg/m2  SpO2 100%  LMP 02/01/2015 Physical Exam  Constitutional: She is oriented to person, place, and time. She appears well-developed and well-nourished.  HENT:  Head: Normocephalic.  Eyes: Conjunctivae are normal.  Neck: Normal range of motion. Neck supple.  Pulmonary/Chest: Effort normal.  Musculoskeletal: Normal range of motion.  Neurological: She is alert and oriented to person, place, and time.  Skin: Skin is warm and dry.  Psychiatric: She has a normal mood and affect. Her behavior is normal.  Nursing note and vitals reviewed. note- awake, alert, NAD, lungs CTAB, CV RRR no murmur, abdomen- soft nontender, nabs, pelvic- NEFG, no CMT no adnexal tenderness, no significant discharge or bleeding  ED Course  Procedures (including critical care time) DIAGNOSTIC STUDIES: Oxygen Saturation is 98% on room air, normal by my interpretation.    COORDINATION OF CARE: 4:19  PM Discussed treatment plan with patient at beside, the patient agrees with the plan and has no further questions at this time.   Labs Review Labs Reviewed  WET PREP, GENITAL - Abnormal; Notable for the following:    Clue Cells Wet Prep HPF POC MODERATE (*)    WBC, Wet Prep HPF POC MODERATE (*)    All other components within normal limits  URINALYSIS, ROUTINE W REFLEX MICROSCOPIC (NOT AT [facility]) - Abnormal; Notable for the following:    APPearance CLOUDY (*)    Nitrite POSITIVE (*)    Leukocytes, UA SMALL (*)    All other components within normal limits  URINE MICROSCOPIC-ADD ON - Abnormal; Notable for the following:    Squamous Epithelial / LPF FEW (*)    Bacteria, UA MANY (*)    All other components within normal limits  PREGNANCY, URINE  HIV ANTIBODY (ROUTINE TESTING)  GC/CHLAMYDIA PROBE AMP ([place]) NOT AT [facility]    Imaging Review No results found.   EKG Interpretation None      MDM   Final diagnoses:  UTI (lower urinary tract infection)  Bacterial vaginosis   Pt presenting with dysuria- ua is c/w UTI.  Pt will be started on keflex.  Pt also c/o vaginal discharge and concern for recurring BV.  Pelvic exam without signs PID, otherwise reassuring.  Does show moderate clue cells.  Pt given rx for flagyl as well.  Discharged with strict return precautions.  Pt agreeable with plan.  Prior records reviewed and considered during this visit Nursing notes including past medical history and social history reviewed and considered in documentation   I personally performed the services described in this documentation, which was scribed in my presence. The recorded information has been reviewed and is accurate.    Jerelyn Scott, MD 03/01/15 1101

## 2015-02-27 NOTE — ED Notes (Signed)
Pt c/o painful freq  urination x 1 month 

## 2015-02-27 NOTE — Discharge Instructions (Signed)
Return to the ED with any concerns including vomiting and not able to keep down liquids, worsening pain, fever/chills, decreased level of alertness/lethargy, or any other alarming symptoms °

## 2015-02-28 LAB — GC/CHLAMYDIA PROBE AMP (~~LOC~~) NOT AT ARMC
Chlamydia: NEGATIVE
NEISSERIA GONORRHEA: NEGATIVE

## 2015-03-01 LAB — HIV ANTIBODY (ROUTINE TESTING W REFLEX): HIV Screen 4th Generation wRfx: NONREACTIVE

## 2015-05-18 ENCOUNTER — Encounter (HOSPITAL_BASED_OUTPATIENT_CLINIC_OR_DEPARTMENT_OTHER): Payer: Self-pay | Admitting: *Deleted

## 2015-05-18 ENCOUNTER — Emergency Department (HOSPITAL_BASED_OUTPATIENT_CLINIC_OR_DEPARTMENT_OTHER): Payer: Managed Care, Other (non HMO)

## 2015-05-18 ENCOUNTER — Emergency Department (HOSPITAL_BASED_OUTPATIENT_CLINIC_OR_DEPARTMENT_OTHER)
Admission: EM | Admit: 2015-05-18 | Discharge: 2015-05-18 | Disposition: A | Discharge status: Home / Self Care | Payer: Managed Care, Other (non HMO) | Attending: Emergency Medicine | Admitting: Emergency Medicine

## 2015-05-18 DIAGNOSIS — M549 Dorsalgia, unspecified: Secondary | ICD-10-CM | POA: Insufficient documentation

## 2015-05-18 DIAGNOSIS — N939 Abnormal uterine and vaginal bleeding, unspecified: Secondary | ICD-10-CM

## 2015-05-18 DIAGNOSIS — Z8619 Personal history of other infectious and parasitic diseases: Secondary | ICD-10-CM | POA: Insufficient documentation

## 2015-05-18 DIAGNOSIS — O200 Threatened abortion: Secondary | ICD-10-CM

## 2015-05-18 DIAGNOSIS — Z3A01 Less than 8 weeks gestation of pregnancy: Secondary | ICD-10-CM | POA: Insufficient documentation

## 2015-05-18 DIAGNOSIS — Z8639 Personal history of other endocrine, nutritional and metabolic disease: Secondary | ICD-10-CM | POA: Insufficient documentation

## 2015-05-18 DIAGNOSIS — O9989 Other specified diseases and conditions complicating pregnancy, childbirth and the puerperium: Secondary | ICD-10-CM | POA: Insufficient documentation

## 2015-05-18 DIAGNOSIS — Z87440 Personal history of urinary (tract) infections: Secondary | ICD-10-CM | POA: Insufficient documentation

## 2015-05-18 LAB — CBC WITH DIFFERENTIAL/PLATELET
BASOS ABS: 0 10*3/uL (ref 0.0–0.1)
Basophils Relative: 0 %
Eosinophils Absolute: 0 10*3/uL (ref 0.0–0.7)
Eosinophils Relative: 1 %
HEMATOCRIT: 31.5 % — AB (ref 36.0–46.0)
Hemoglobin: 10.8 g/dL — ABNORMAL LOW (ref 12.0–15.0)
Lymphocytes Relative: 56 %
Lymphs Abs: 2 10*3/uL (ref 0.7–4.0)
MCH: 28 pg (ref 26.0–34.0)
MCHC: 34.3 g/dL (ref 30.0–36.0)
MCV: 81.6 fL (ref 78.0–100.0)
MONO ABS: 0.4 10*3/uL (ref 0.1–1.0)
Monocytes Relative: 11 %
NEUTROS ABS: 1.1 10*3/uL — AB (ref 1.7–7.7)
Neutrophils Relative %: 32 %
Platelets: 216 10*3/uL (ref 150–400)
RBC: 3.86 MIL/uL — AB (ref 3.87–5.11)
RDW: 12.4 % (ref 11.5–15.5)
WBC: 3.5 10*3/uL — AB (ref 4.0–10.5)

## 2015-05-18 LAB — PREGNANCY, URINE: Preg Test, Ur: NEGATIVE

## 2015-05-18 LAB — WET PREP, GENITAL
Trich, Wet Prep: NONE SEEN
YEAST WET PREP: NONE SEEN

## 2015-05-18 LAB — HCG, QUANTITATIVE, PREGNANCY: HCG, BETA CHAIN, QUANT, S: 41 m[IU]/mL — AB (ref <5)

## 2015-05-18 NOTE — ED Notes (Signed)
MD aware of the patients need to leave.

## 2015-05-18 NOTE — Discharge Instructions (Signed)
Repeat quantitative hCG the pregnancy hormone number and not 3 days will help determine whether pregnancy is continuing to develop or whether this was a miscarriage. Return for any new or worse symptoms to include worse bleeding or increased pain.   Emergency Department Resource Guide 1) Find a Doctor and Pay Out of Pocket Although you won't have to find out who is covered by your insurance plan, it is a good idea to ask around and get recommendations. You will then need to call the office and see if the doctor you have chosen will accept you as a new patient and what types of options they offer for patients who are self-pay. Some doctors offer discounts or will set up payment plans for their patients who do not have insurance, but you will need to ask so you aren't surprised when you get to your appointment.  2) Contact Your Local Health Department Not all health departments have doctors that can see patients for sick visits, but many do, so it is worth a call to see if yours does. If you don't know where your local health department is, you can check in your phone book. The CDC also has a tool to help you locate your state's health department, and many state websites also have listings of all of their local health departments.  3) Find a Walk-in Clinic If your illness is not likely to be very severe or complicated, you may want to try a walk in clinic. These are popping up all over the country in pharmacies, drugstores, and shopping centers. They're usually staffed by nurse practitioners or physician assistants that have been trained to treat common illnesses and complaints. They're usually fairly quick and inexpensive. However, if you have serious medical issues or chronic medical problems, these are probably not your best option.  No Primary Care Doctor: - Call Health Connect at  [phone] - they can help you locate a primary care doctor that  accepts your insurance, provides certain services,  etc. - Physician Referral Service- [phone]  Chronic Pain Problems: Organization         Address  Phone   Notes  [facility]  [phone] Patients need to be referred by their primary care doctor.   Medication Assistance: Organization         Address  Phone   Notes  [facility] [address] [place], [place] [address] [phone] --Must be a resident of [place] [facility] -- Must have NO insurance coverage whatsoever (no Medicaid/ Medicare, etc.) -- The pt. MUST have a primary care doctor that directs their care regularly and follows them in the community   MedAssist  [phone]   [employer]  [phone]    Agencies that provide inexpensive medical care: Organization         Address  Phone   Notes  [facility]  [phone]   [facility]    [phone]   [facility] [address] [place], [place] [address] [phone]   [facility] [address], [place] [phone]   Planned Parenthood    [phone]   [facility]    [phone]   [facility]  [address], [place] Phone:  [phone], Fax:  [phone] Hours of Operation:  9 am - 6 pm, M-F.  Also accepts Medicaid/Medicare and  self-pay.  [facility] for [place], [address], [place] Phone: [phone], Fax: [phone]. Hours of Operation:  8:30 am - 5:30 pm, M-F.  Also accepts Medicaid and self-pay.  [facility] [place] [address] [place] Phone: [phone]   [place], [place], [place] [phone], Ext. [phone] Mondays & Thursdays: 7-9 AM.  First 15 patients are seen on a first come, first serve basis.    [place] Providers:  Organization         Address  Phone   Notes  [facility] [address], [place] [phone] Also accepts self-pay patients.  [facility] [id number] [place], [place]  [phone]   [place], [address], [place] [phone]   [facility] [address], [place] [phone]   Lucianne Lei [address], [place]   [phone] Only accepts [place] Access [place] patients after they have their name applied to their card.   Self-Pay (no insurance) in [facility]:  Organization         Address  Phone   Notes  Sickle Cell Patients, [facility] [place] [phone]   [facility] [place] [phone]   [facility] [place]  [place], [place], [place] [phone]   [facility]/Dr. Osei-Bonsu  [address], [place] or [address], [place] [phone] Phone number for both [place] and [place] locations is the same.  Urgent Medical and [facility] [place] [address] [place] [phone]   [facility] [address], [place] or [address] [phone]   [facility] [address] [place] [phone], phone; [phone], fax Sees patients 1st and 3rd Saturday of every month.  Must not qualify for public or private insurance (i.e. Medicaid, Medicare, [place] Health Choice, Veterans' Benefits)  Household income should be no more than 200% of the poverty level The clinic cannot treat you if you are pregnant or think you are pregnant  Sexually transmitted diseases are not treated at the clinic.    Dental Care: Organization         Address  Phone  Notes  [facility] [place] [phone] Accepts children up to  age 32 who are enrolled in [place] or [place]; pregnant women with a Medicaid card; and children who have applied for Medicaid or [place] Health Choice, but were declined, whose parents can pay a reduced fee at time of service.  [facility] Department of [facility]  [address], [place] [phone] Accepts children up to age 69 who are enrolled in [place] or [place]; pregnant women with a Medicaid card; and children who have applied for Medicaid or  Health Choice, but were declined, whose parents can pay a reduced fee at time of service.  [facility] Adult [facility] Access PROGRAM  [place] [phone] Patients are seen by appointment only. Walk-ins are not accepted. [place] will see patients 72 years of age and older. Monday - Tuesday (8am-5pm) Most Wednesdays (8:30-5pm) $  30 per visit, cash only  [facility] Adult [employer] PROGRAM  [address], [facility] [phone] Patients are seen by appointment only. Walk-ins are not accepted. [place] will see patients 41 years of age and older. One Wednesday Evening (Monthly: Volunteer Based).  $30 per visit, cash only  [place]  [phone] for adults; Children under age 58, call Graduate Pediatric Dentistry at [phone]. Children aged 52-14, please call [phone] to request a pediatric application.  Dental services are provided in all areas of dental care including fillings, crowns and bridges, complete and partial dentures, implants, gum treatment, root canals, and extractions. Preventive care is also provided. Treatment is provided to both adults and children. Patients are selected via a lottery and there is often a waiting list.   [facility] [address], [place]  [phone] [email or web address]   [facility] [address] [place], [place] [phone], Ext. [phone] Second and Fourth Thursday of  each month, opens at 6:30 AM; Clinic ends at 9 AM.  Patients are seen on a first-come first-served basis, and a limited number are seen during each clinic.   [facility]  [address] Hillard Danker [place], [place] [phone]   Eligibility Requirements You must have lived in [place], [place], or [place] counties for at least the last three months.   You cannot be eligible for state or federal sponsored [employer], including [employer], [place], or [employer].   You generally cannot be eligible for healthcare insurance through your employer.    How to apply: Eligibility screenings are held every Tuesday and Wednesday afternoon from 1:00 pm until 4:00 pm. You do not need an appointment for the interview!  [facility] [facility] [address] [place], [place]   [place]  [place] Department  [place]  [phone]    Behavioral Health Resources in the Community: Intensive Outpatient Programs Organization         Address  Phone  Notes  [place]. [address], [place], [place] [phone]   [facility] [address] [place], [place]   ADS: Alcohol & Drug S[facility]cs [address] [place], [place]   [place] [address],  [place], [place] or [phone]   Substance Abuse Resources Organization         Address  Phone  Notes  Alcohol and Drug Services  [phone]   [place]  [phone]   [place]   Chinita Pester  [phone]   Residential & Outpatient Substance Abuse Program  [phone]   Psychological Services Organization         Address  Phone  Notes  [facility] [place]  [place]  [phone]   [place] [address]  [place] or [phone]    Mobile Crisis Teams Organization         Address  Phone  Notes  [facility], Mobile Crisis Care Unit  [phone]   [facility]ive Psychotherapeutic Services  [address] [place], [place]   [place] Levels [address] [place] [phone]    Self-Help/Support Groups Organization         Address  Phone  Notes  [facility] of [place] - variety of support groups  [place] Call for more information  Narcotics Anonymous (NA), Caring Services [address], [employer] [place]  2 meetings at this location   Special educational needs teacher         Address  Phone  Notes  ASAP Residential Treatment [place],    [place]  [phone]   [facility]  [address], [place] [phone], [place], [place]   [place], [place] [phone] Admissions: 8am-3pm M-F  Incentives Substance [place] [address],    [place], [place] [phone]   [facility] [address] [place], [place], [place]   [facility] [address]  [place], [place]   [facility]ight Programs - Intensive Outpatient [address] Krist[address], [place], [place]   [facility] ([place].) [place].,  [place], [place] [phone] or [phone]   [facility]idential Treatment Services (RTS) [address] [place], [place] Accepts Medicaid  [facility] [place] [address],  [place] [phone] Substance Abuse/Addiction Treatment   [facility] Organization         Address  Phone  Notes  [facility]  [phone]   Domenic Schwab, PhD [address] Arlis Port[place], [place]   [phone] or [phone]   [place], [place] [phone]     [facility] [address], [place], [place] [phone] Insurance/Medicaid/sponsorship through [facility] and Families [address], [address]                                    [place], [place] [phone] [place] [address], [place] [phone]    Dr. Adele Schilder  [phone]   [facility] Dept. 1) [address], [place] 2) [place] 3)  [place], [place] [phone]  [phone]   [place] [phone] or [phone] (After Hours)

## 2015-05-18 NOTE — ED Notes (Signed)
C/o menstrual period 2 weeks that is still going on. Usual period is 3 days. C/o cramps and foul odor.

## 2015-05-18 NOTE — ED Provider Notes (Signed)
CSN: [id number]     Arrival date & time 05/18/15  0809 History   First MD Initiated Contact with Patient 05/18/15 0815     No chief complaint on file.    (Consider location/radiation/quality/duration/timing/severity/associated sxs/prior Treatment) The history is provided by the patient.   29 year old female presents with the 10 days of vaginal bleeding. Last menstrual period was 3 weeks ago. Patient not on any birth control pills. Patient feels that there is an odor to the vaginal bleeding. Associated with nausea no vomiting mild abdominal pain that radiates to the back. Patient gravida 5 para 2, stasis chance she could be pregnant. Patient does not have a GYN provider. Patient lives in [place] area.  Past Medical History  Diagnosis Date  . Goiter   . UTI (lower urinary tract infection)   . BV (bacterial vaginosis)    Past Surgical History  Procedure Laterality Date  . Tonsillectomy    . Wisdom tooth extraction     No family history on file. Social History  Substance Use Topics  . Smoking status: Never Smoker   . Smokeless tobacco: Never Used  . Alcohol Use: No   OB History    No data available     Review of Systems  Constitutional: Negative for fever.  HENT: Negative for congestion.   Eyes: Negative for visual disturbance.  Respiratory: Negative for shortness of breath.   Cardiovascular: Negative for chest pain.  Gastrointestinal: Positive for nausea and abdominal pain. Negative for vomiting.  Genitourinary: Positive for vaginal bleeding.  Musculoskeletal: Positive for back pain.  Skin: Negative for rash.  Neurological: Negative for headaches.  Hematological: Does not bruise/bleed easily.  Psychiatric/Behavioral: Negative for confusion.      Allergies  Review of patient's allergies indicates no known allergies.  Home Medications   Prior to Admission medications   Not on File   BP 99/60 mmHg  Pulse 72  Temp(Src) 98.1 F (36.7 C) (Oral)  Resp 16  Ht   (1.575 m)  Wt 130 lb (58.968 kg)  BMI 23.77 kg/m2  SpO2 100%  LMP 05/08/2015 Physical Exam  Constitutional: She is oriented to person, place, and time. She appears well-developed and well-nourished. No distress.  HENT:  Head: Normocephalic and atraumatic.  Mouth/Throat: Oropharynx is clear and moist.  Eyes: Conjunctivae and EOM are normal. Pupils are equal, round, and reactive to light.  Neck: Normal range of motion. Neck supple.  Cardiovascular: Normal rate and regular rhythm.   No murmur heard. Pulmonary/Chest: Effort normal and breath sounds normal. No respiratory distress.  Abdominal: Soft. There is no tenderness.  Genitourinary: No vaginal discharge found.  Normal external genitalia. Red and dark blood in the vaginal vault no clots. No cervical motion tenderness. No uterine tenderness. Mild to moderate left adnexal tenderness. Right adnexa nontender. No purulent discharge. Cervical os was closed.  Musculoskeletal: Normal range of motion.  Neurological: She is alert and oriented to person, place, and time. No cranial nerve deficit. She exhibits normal muscle tone. Coordination normal.  Skin: Skin is warm. No rash noted.  Nursing note and vitals reviewed.   ED Course  Procedures (including critical care time) Labs Review Labs Reviewed  WET PREP, GENITAL - Abnormal; Notable for the following:    Clue Cells Wet Prep HPF POC FEW (*)    WBC, Wet Prep HPF POC MODERATE (*)    All other components within normal limits  HCG, QUANTITATIVE, PREGNANCY - Abnormal; Notable for the following:    hCG, Beta  Chain, Quant, S 41 (*)    All other components within normal limits  CBC WITH DIFFERENTIAL/PLATELET - Abnormal; Notable for the following:    WBC 3.5 (*)    RBC 3.86 (*)    Hemoglobin 10.8 (*)    HCT 31.5 (*)    Neutro Abs 1.1 (*)    All other components within normal limits  PREGNANCY, URINE  GC/CHLAMYDIA PROBE AMP ([place]) NOT AT [facility]   Results for orders placed or  performed during the hospital encounter of 05/18/15  Wet prep, genital  Result Value Ref Range   Yeast Wet Prep HPF POC NONE SEEN NONE SEEN   Trich, Wet Prep NONE SEEN NONE SEEN   Clue Cells Wet Prep HPF POC FEW (A) NONE SEEN   WBC, Wet Prep HPF POC MODERATE (A) NONE SEEN  Pregnancy, urine  Result Value Ref Range   Preg Test, Ur NEGATIVE NEGATIVE  hCG, quantitative, pregnancy  Result Value Ref Range   hCG, Beta Chain, Quant, S 41 (H) <5 mIU/mL  CBC with Differential/Platelet  Result Value Ref Range   WBC 3.5 (L) 4.0 - 10.5 K/uL   RBC 3.86 (L) 3.87 - 5.11 MIL/uL   Hemoglobin 10.8 (L) 12.0 - 15.0 g/dL   HCT 16.1 (L) 09.6 - 04.5 %   MCV 81.6 78.0 - 100.0 fL   MCH 28.0 26.0 - 34.0 pg   MCHC 34.3 30.0 - 36.0 g/dL   RDW 40.9 81.1 - 91.4 %   Platelets 216 150 - 400 K/uL   Neutrophils Relative % 32 %   Neutro Abs 1.1 (L) 1.7 - 7.7 K/uL   Lymphocytes Relative 56 %   Lymphs Abs 2.0 0.7 - 4.0 K/uL   Monocytes Relative 11 %   Monocytes Absolute 0.4 0.1 - 1.0 K/uL   Eosinophils Relative 1 %   Eosinophils Absolute 0.0 0.0 - 0.7 K/uL   Basophils Relative 0 %   Basophils Absolute 0.0 0.0 - 0.1 K/uL     Imaging Review US Ob Comp Less 14 Wks  05/18/2015  CLINICAL DATA:  Abnormal menstrual period onset May 08, 2015, persistent bleeding since, mild left lower quadrant discomfort with onset today, negative urine pregnancy test but has serum beta HCG of 41. EXAM: OBSTETRIC <14 WK [place] AND TRANSVAGINAL OB US TECHNIQUE: Both transabdominal and transvaginal ultrasound examinations were performed for complete evaluation of the gestation as well as the maternal uterus, adnexal regions, and pelvic cul-de-sac. Transvaginal technique was performed to assess early pregnancy. COMPARISON:  None in PACs FINDINGS: Intrauterine gestational sac: None visualize. Yolk sac:  None Embryo:  None Cardiac Activity: None Heart Rate: N/A  bpm Maternal uterus/adnexae: The uterus is empty. The endometrial stripe does not  appear abnormally thickened. The right ovary is normal in echotexture. The left ovary exhibits a 1.9 x 1.6 x 1.3 cm focus of slight increased echogenicity without significant internal vascularity. There is no free pelvic fluid. IMPRESSION: 1. No IUP is observed. 2. There is no objective evidence of an ectopic pregnancy. 3. Subtle slightly hyperechoic, non hypervascular, ovoid focus measuring just under 2 cm in diameter in the left ovary. Serial follow-up beta HCG determinations are recommended. A follow-up pelvic ultrasound in 4-6 weeks or earlier if indicated clinically is recommended. Electronically Signed   By: David  [place] M.D.   On: 05/18/2015 11:16   US Ob Transvaginal  05/18/2015  CLINICAL DATA:  Abnormal menstrual period onset May 08, 2015, persistent bleeding since, mild left lower quadrant discomfort  with onset today, negative urine pregnancy test but has serum beta HCG of 41. EXAM: OBSTETRIC <14 WK US AND TRANSVAGINAL OB US TECHNIQUE: Both transabdominal and transvaginal ultrasound examinations were performed for complete evaluation of the gestation as well as the maternal uterus, adnexal regions, and pelvic cul-de-sac. Transvaginal technique was performed to assess early pregnancy. COMPARISON:  None in PACs FINDINGS: Intrauterine gestational sac: None visualize. Yolk sac:  None Embryo:  None Cardiac Activity: None Heart Rate: N/A  bpm Maternal uterus/adnexae: The uterus is empty. The endometrial stripe does not appear abnormally thickened. The right ovary is normal in echotexture. The left ovary exhibits a 1.9 x 1.6 x 1.3 cm focus of slight increased echogenicity without significant internal vascularity. There is no free pelvic fluid. IMPRESSION: 1. No IUP is observed. 2. There is no objective evidence of an ectopic pregnancy. 3. Subtle slightly hyperechoic, non hypervascular, ovoid focus measuring just under 2 cm in diameter in the left ovary. Serial follow-up beta HCG determinations are  recommended. A follow-up pelvic ultrasound in 4-6 weeks or earlier if indicated clinically is recommended. Electronically Signed   By: David  [place] M.D.   On: 05/18/2015 11:16   I have personally reviewed and evaluated these images and lab results as part of my medical decision-making.   EKG Interpretation None      MDM   Final diagnoses:  Vaginal bleeding  Threatened miscarriage in early pregnancy   Ultrasound done not confirmatory for IUP. Also no fluid in the pelvis area to suggest the bleeding. Most likely this is a threatened miscarriage or a missed miscarriage. Patient did have some mild tenderness in the left adnexal area. That may represent what is being seen on the ultrasound. This very well could be a corpus luteum cyst that's resolving. Patient needs repeat quantitative hCG in greater than 48 hours. Patient given precautions and will return for any new or worse symptoms.       Vanetta MuldersScott Tamon Parkerson, MD 05/18/15 1313

## 2015-05-19 LAB — GC/CHLAMYDIA PROBE AMP (~~LOC~~) NOT AT ARMC
CHLAMYDIA, DNA PROBE: NEGATIVE
Neisseria Gonorrhea: NEGATIVE

## 2015-05-25 ENCOUNTER — Emergency Department (HOSPITAL_BASED_OUTPATIENT_CLINIC_OR_DEPARTMENT_OTHER)
Admission: EM | Admit: 2015-05-25 | Discharge: 2015-05-25 | Disposition: A | Discharge status: Home / Self Care | Payer: Self-pay | Attending: Emergency Medicine | Admitting: Emergency Medicine

## 2015-05-25 ENCOUNTER — Encounter (HOSPITAL_BASED_OUTPATIENT_CLINIC_OR_DEPARTMENT_OTHER): Payer: Self-pay | Admitting: Emergency Medicine

## 2015-05-25 DIAGNOSIS — Z8639 Personal history of other endocrine, nutritional and metabolic disease: Secondary | ICD-10-CM | POA: Insufficient documentation

## 2015-05-25 DIAGNOSIS — N3000 Acute cystitis without hematuria: Secondary | ICD-10-CM | POA: Insufficient documentation

## 2015-05-25 DIAGNOSIS — N760 Acute vaginitis: Secondary | ICD-10-CM | POA: Insufficient documentation

## 2015-05-25 DIAGNOSIS — B9689 Other specified bacterial agents as the cause of diseases classified elsewhere: Secondary | ICD-10-CM

## 2015-05-25 DIAGNOSIS — Z87440 Personal history of urinary (tract) infections: Secondary | ICD-10-CM | POA: Insufficient documentation

## 2015-05-25 DIAGNOSIS — Z3202 Encounter for pregnancy test, result negative: Secondary | ICD-10-CM | POA: Insufficient documentation

## 2015-05-25 LAB — PREGNANCY, URINE: Preg Test, Ur: NEGATIVE

## 2015-05-25 LAB — WET PREP, GENITAL
Trich, Wet Prep: NONE SEEN
Yeast Wet Prep HPF POC: NONE SEEN

## 2015-05-25 LAB — URINE MICROSCOPIC-ADD ON

## 2015-05-25 LAB — URINALYSIS, ROUTINE W REFLEX MICROSCOPIC
BILIRUBIN URINE: NEGATIVE
Glucose, UA: NEGATIVE mg/dL
Hgb urine dipstick: NEGATIVE
KETONES UR: NEGATIVE mg/dL
NITRITE: NEGATIVE
Protein, ur: NEGATIVE mg/dL
Specific Gravity, Urine: 1.017 (ref 1.005–1.030)
Urobilinogen, UA: 0.2 mg/dL (ref 0.0–1.0)
pH: 5.5 (ref 5.0–8.0)

## 2015-05-25 MED ORDER — METRONIDAZOLE 500 MG PO TABS: 500.0000 mg | ORAL_TABLET | Freq: Two times a day (BID) | ORAL | Status: DC

## 2015-05-25 MED ORDER — NITROFURANTOIN MONOHYD MACRO 100 MG PO CAPS: 100.0000 mg | ORAL_CAPSULE | Freq: Two times a day (BID) | ORAL | Status: DC

## 2015-05-25 MED ORDER — METRONIDAZOLE 0.75 % VA GEL: 1.0000 | Freq: Two times a day (BID) | VAGINAL | Status: DC

## 2015-05-25 MED ORDER — METRONIDAZOLE 500 MG PO TABS
500.0000 mg | ORAL_TABLET | Freq: Once | ORAL | Status: AC
  Administered 2015-05-25: 500 mg via ORAL
  Filled 2015-05-25: qty 1

## 2015-05-25 MED ORDER — NITROFURANTOIN MONOHYD MACRO 100 MG PO CAPS
100.0000 mg | ORAL_CAPSULE | Freq: Once | ORAL | Status: AC
  Administered 2015-05-25: 100 mg via ORAL
  Filled 2015-05-25: qty 1

## 2015-05-25 NOTE — ED Notes (Signed)
Patient states that she is having a a discharge that makes her feel like she had a bacterial vaginosis and possibly a UTi

## 2015-05-25 NOTE — ED Provider Notes (Signed)
CSN: [id number]     Arrival date & time 05/25/15  0030 History   First MD Initiated Contact with Patient 05/25/15 0221     Chief Complaint  Patient presents with  . Vaginal Discharge     (Consider location/radiation/quality/duration/timing/severity/associated sxs/prior Treatment) HPI  This is a 29 year old female who was seen on the 12th of this month for about 2 weeks of vaginal bleeding. She suspected she had had a miscarriage and a quantitative beta-hCG was 41. Her bleeding is subsequently stopped. She is here now with a clear, slightly brownish vaginal discharge which makes her suspects she has bacterial vaginosis. She is also having some mild pressure that makes her think she may have a urinary tract infection. She has a history of both in the past.  Past Medical History  Diagnosis Date  . Goiter   . UTI (lower urinary tract infection)   . BV (bacterial vaginosis)    Past Surgical History  Procedure Laterality Date  . Tonsillectomy    . Wisdom tooth extraction     History reviewed. No pertinent family history. Social History  Substance Use Topics  . Smoking status: Never Smoker   . Smokeless tobacco: Never Used  . Alcohol Use: No   OB History    No data available     Review of Systems  All other systems reviewed and are negative.   Allergies  Review of patient's allergies indicates no known allergies.  Home Medications   Prior to Admission medications   Not on File   BP 129/78 mmHg  Pulse 73  Temp(Src) 98 F (36.7 C) (Oral)  Resp 16  Ht  (1.575 m)  Wt 130 lb (58.968 kg)  BMI 23.77 kg/m2  SpO2 100%  LMP 05/08/2015   Physical Exam  General: Well-developed, well-nourished female in no acute distress; appearance consistent with age of record HENT: normocephalic; atraumatic Eyes: pupils equal, round and reactive to light; extraocular muscles intact Neck: supple Heart: regular rate and rhythm Lungs: clear to auscultation bilaterally Abdomen: soft;  nondistended; nontender; no masses or hepatosplenomegaly; bowel sounds present GU: Normal external genitalia; physiologic appearing vaginal discharge; no vaginal bleeding; no cervical motion tenderness Extremities: No deformity; full range of motion; pulses normal Neurologic: Awake, alert and oriented; motor function intact in all extremities and symmetric; no facial droop Skin: Warm and dry Psychiatric: Normal mood and affect    ED Course  Procedures (including critical care time)   MDM   Nursing notes and vitals signs, including pulse oximetry, reviewed.  Summary of this visit's results, reviewed by myself:  Labs:  Results for orders placed or performed during the hospital encounter of 05/25/15 (from the past 24 hour(s))  Urinalysis, Routine w reflex microscopic (not at [facility])     Status: Abnormal   Collection Time: 05/25/15 12:47 AM  Result Value Ref Range   Color, Urine YELLOW YELLOW   APPearance CLOUDY (A) CLEAR   Specific Gravity, Urine 1.017 1.005 - 1.030   pH 5.5 5.0 - 8.0   Glucose, UA NEGATIVE NEGATIVE mg/dL   Hgb urine dipstick NEGATIVE NEGATIVE   Bilirubin Urine NEGATIVE NEGATIVE   Ketones, ur NEGATIVE NEGATIVE mg/dL   Protein, ur NEGATIVE NEGATIVE mg/dL   Urobilinogen, UA 0.2 0.0 - 1.0 mg/dL   Nitrite NEGATIVE NEGATIVE   Leukocytes, UA MODERATE (A) NEGATIVE  Pregnancy, urine     Status: None   Collection Time: 05/25/15 12:47 AM  Result Value Ref Range   Preg Test, Ur NEGATIVE NEGATIVE  Urine microscopic-add on     Status: Abnormal   Collection Time: 05/25/15 12:47 AM  Result Value Ref Range   Squamous Epithelial / LPF FEW (A) RARE   WBC, UA 11-20 <3 WBC/hpf   RBC / HPF 0-2 <3 RBC/hpf   Bacteria, UA MANY (A) RARE  Wet prep, genital     Status: Abnormal   Collection Time: 05/25/15  2:30 AM  Result Value Ref Range   Yeast Wet Prep HPF POC NONE SEEN NONE SEEN   Trich, Wet Prep NONE SEEN NONE SEEN   Clue Cells Wet Prep HPF POC FEW (A) NONE SEEN   WBC, Wet  Prep HPF POC MODERATE (A) NONE SEEN     Paula LibraJohn Amyah Clawson, MD 05/25/15 [phone]

## 2015-05-26 LAB — GC/CHLAMYDIA PROBE AMP (~~LOC~~) NOT AT ARMC
Chlamydia: NEGATIVE
NEISSERIA GONORRHEA: NEGATIVE

## 2015-05-27 LAB — URINE CULTURE: Culture: >=100000

## 2015-05-28 ENCOUNTER — Telehealth (HOSPITAL_COMMUNITY): Payer: Self-pay

## 2015-05-28 NOTE — Progress Notes (Signed)
ED Antimicrobial Stewardship Positive Culture Follow Up   Janice Graham is an 29 y.o. female who [facility] with a chief complaint of  Chief Complaint  Patient presents with  . Vaginal Discharge    Recent Results (from the past 720 hour(s))  Wet prep, genital     Status: Abnormal   Collection Time: 05/18/15  8:35 AM  Result Value Ref Range Status   Yeast Wet Prep HPF POC NONE SEEN NONE SEEN Final   Trich, Wet Prep NONE SEEN NONE SEEN Final   Clue Cells Wet Prep HPF POC FEW (A) NONE SEEN Final   WBC, Wet Prep HPF POC MODERATE (A) NONE SEEN Final  Urine culture     Status: None   Collection Time: 05/25/15 12:47 AM  Result Value Ref Range Status   Specimen Description URINE, CLEAN CATCH  Final   Special Requests NONE  Final   Culture   Final    >=100,000 COLONIES/mL ENTEROBACTER CLOACAE Performed at [facility]    Report Status 05/27/2015 FINAL  Final   Organism ID, Bacteria ENTEROBACTER CLOACAE  Final      Susceptibility   Enterobacter cloacae - MIC*    CEFAZOLIN <=4 RESISTANT Resistant     CEFTRIAXONE <=1 SENSITIVE Sensitive     CIPROFLOXACIN <=0.25 SENSITIVE Sensitive     GENTAMICIN <=1 SENSITIVE Sensitive     IMIPENEM <=0.25 SENSITIVE Sensitive     NITROFURANTOIN 64 INTERMEDIATE Intermediate     TRIMETH/SULFA <=20 SENSITIVE Sensitive     PIP/TAZO <=4 SENSITIVE Sensitive     * >=100,000 COLONIES/mL ENTEROBACTER CLOACAE  Wet prep, genital     Status: Abnormal   Collection Time: 05/25/15  2:30 AM  Result Value Ref Range Status   Yeast Wet Prep HPF POC NONE SEEN NONE SEEN Final   Trich, Wet Prep NONE SEEN NONE SEEN Final   Clue Cells Wet Prep HPF POC FEW (A) NONE SEEN Final   WBC, Wet Prep HPF POC MODERATE (A) NONE SEEN Final    [x]  Treated with nitrofurantoin, organism intermediate to prescribed antimicrobial  New antibiotic prescription: Cipro 500 mg PO BID x 5 days  ED Provider: Teressa LowerVrinda Pickering, NP  Cassie L. Roseanne RenoStewart,  PharmD PGY2 Infectious Diseases Pharmacy Resident Pager: [phone] 05/28/2015 10:27 AM

## 2015-05-28 NOTE — Telephone Encounter (Signed)
Post ED Visit - Positive Culture Follow-up: Chart Hand-off to ED Flow Manager  Culture assessed and recommendations reviewed by: []  Isaac BlissMichael Maccia, Pharm.D., BCPS []  Celedonio MiyamotoJeremy Frens, Pharm.D., BCPS-AQ ID []  Georgina PillionElizabeth Martin, Pharm.D., BCPS []  Eaton RapidsMinh Pham, [address] BoulevardPharm.D., BCPS, AAHIVP []  Estella HuskMichelle Turner, Pharm .D., BCPS, AAHIVP [x]  Tennis Mustassie Stewart, Pharm.D. []  Casilda Carlsaylor Stone, Pharm.D.  Positive urine culture  []  Patient discharged without antimicrobial prescription and treatment is now indicated [x]  Organism is resistant to prescribed ED discharge antimicrobial []  Patient with positive blood cultures  Changes discussed with ED provider: Teressa LowerVrinda Pickering New antibiotic prescription cipro 500 mg po bid x 5 days  Pt informed. [facility] [address]. Left on voice mail   Ashley JacobsFesterman, Shamecca Whitebread C 05/28/2015, 10:46 AM

## 2015-07-30 ENCOUNTER — Encounter (HOSPITAL_BASED_OUTPATIENT_CLINIC_OR_DEPARTMENT_OTHER): Payer: Self-pay | Admitting: *Deleted

## 2015-07-30 ENCOUNTER — Emergency Department (HOSPITAL_BASED_OUTPATIENT_CLINIC_OR_DEPARTMENT_OTHER)
Admission: EM | Admit: 2015-07-30 | Discharge: 2015-07-30 | Disposition: A | Discharge status: Home / Self Care | Payer: Self-pay | Attending: Emergency Medicine | Admitting: Emergency Medicine

## 2015-07-30 DIAGNOSIS — Z3A09 9 weeks gestation of pregnancy: Secondary | ICD-10-CM | POA: Insufficient documentation

## 2015-07-30 DIAGNOSIS — O200 Threatened abortion: Secondary | ICD-10-CM | POA: Insufficient documentation

## 2015-07-30 DIAGNOSIS — Z8639 Personal history of other endocrine, nutritional and metabolic disease: Secondary | ICD-10-CM | POA: Insufficient documentation

## 2015-07-30 DIAGNOSIS — Z87440 Personal history of urinary (tract) infections: Secondary | ICD-10-CM | POA: Insufficient documentation

## 2015-07-30 LAB — URINALYSIS, ROUTINE W REFLEX MICROSCOPIC
Bilirubin Urine: NEGATIVE
Glucose, UA: NEGATIVE mg/dL
Hgb urine dipstick: NEGATIVE
KETONES UR: NEGATIVE mg/dL
NITRITE: NEGATIVE
Protein, ur: NEGATIVE mg/dL
Specific Gravity, Urine: 1.013 (ref 1.005–1.030)
pH: 7 (ref 5.0–8.0)

## 2015-07-30 LAB — URINE MICROSCOPIC-ADD ON: RBC / HPF: NONE SEEN RBC/hpf (ref 0–5)

## 2015-07-30 MED ORDER — PROMETHAZINE HCL 25 MG PO TABS
12.5000 mg | ORAL_TABLET | Freq: Once | ORAL | Status: AC
  Administered 2015-07-30: 12.5 mg via ORAL
  Filled 2015-07-30: qty 1

## 2015-07-30 MED ORDER — METOCLOPRAMIDE HCL 10 MG PO TABS: 10.0000 mg | ORAL_TABLET | Freq: Four times a day (QID) | ORAL | Status: DC

## 2015-07-30 MED ORDER — CEPHALEXIN 500 MG PO CAPS: 500.0000 mg | ORAL_CAPSULE | Freq: Two times a day (BID) | ORAL | Status: DC

## 2015-07-30 NOTE — ED Provider Notes (Signed)
CSN: [id number]     Arrival date & time 07/30/15  1610 History  By signing my name below, I, Elon Spanner, attest that this documentation has been prepared under the direction and in the presence of Tilden Fossa, MD. Electronically Signed: Elon Spanner, ED Scribe. 07/30/2015. 6:27 PM.    No chief complaint on file.  The history is provided by the patient. No language interpreter was used.   HPI Comments: Janice Graham is a 29 y.o. G38P2A2, 55 week pregant female who presents to the Emergency Department complaining of lower abdominal cramping onset last night.  She notes associated slight vaginal spotting observed at 2:30 pm today while wiping after urinating.  The patient reports she was evaluated on 12/14 at [facility] ED for abdominal cramping where she had a [place] which showed a 7 weeks and 5 pregnancy as well as a small sobchorionic hemmorrhage.  She has also evaluated on 10/19 in the ED for vaginal bleeding where miscarriage was suspected.  She reports diarrhea normal to baseline that she attributes to her IBS.  Patient has had two elective abortions and no complications with her  She denies fever, vomiting, dysuria, vaginal discharge.  Blood type is O+ on the outside records.  Past Medical History  Diagnosis Date  . Goiter   . UTI (lower urinary tract infection)   . BV (bacterial vaginosis)    Past Surgical History  Procedure Laterality Date  . Tonsillectomy    . Wisdom tooth extraction     No family history on file. Social History  Substance Use Topics  . Smoking status: Never Smoker   . Smokeless tobacco: Never Used  . Alcohol Use: No   OB History    Gravida Para Term Preterm AB TAB SAB Ectopic Multiple Living   1              Review of Systems A complete 10 system review of systems was obtained and all systems are negative except as noted in the HPI and PMH.    Allergies  Review of patient's allergies indicates no known allergies.  Home Medications   Prior to  Admission medications   Medication Sig Start Date End Date Taking? Authorizing Provider  cephALEXin (KEFLEX) 500 MG capsule Take 1 capsule (500 mg total) by mouth 2 (two) times daily. 07/30/15   Tilden Fossa, MD  metoCLOPramide (REGLAN) 10 MG tablet Take 1 tablet (10 mg total) by mouth every 6 (six) hours. 07/30/15   Tilden Fossa, MD   BP 112/76 mmHg  Pulse 86  Temp(Src) 98.6 F (37 C) (Oral)  Resp 17  Ht  (1.575 m)  Wt 135 lb (61.236 kg)  BMI 24.69 kg/m2  SpO2 100%  LMP 05/07/2015 Physical Exam  Constitutional: She is oriented to person, place, and time. She appears well-developed and well-nourished.  HENT:  Head: Normocephalic and atraumatic.  Cardiovascular: Normal rate and regular rhythm.   No murmur heard. Pulmonary/Chest: Effort normal and breath sounds normal. No respiratory distress.  Abdominal: Soft. There is no tenderness. There is no rebound and no guarding.  Genitourinary:  Scant white vaginal discharge, no CMT, no bleeding, os closed  Musculoskeletal: She exhibits no edema or tenderness.  Neurological: She is alert and oriented to person, place, and time.  Skin: Skin is warm and dry.  Psychiatric: She has a normal mood and affect. Her behavior is normal.  Nursing note and vitals reviewed.   ED Course  Procedures (including critical care time)  DIAGNOSTIC  STUDIES: Oxygen Saturation is 100% on RA, normal by my interpretation.    COORDINATION OF CARE:  6:33 PM Will perform  pelivc exam.  Patient acknowledges and agrees with plan.   Reviewed outside records through care everywhere. Labs Review Labs Reviewed  URINALYSIS, ROUTINE W REFLEX MICROSCOPIC (NOT AT [facility]) - Abnormal; Notable for the following:    APPearance CLOUDY (*)    Leukocytes, UA LARGE (*)    All other components within normal limits  URINE MICROSCOPIC-ADD ON - Abnormal; Notable for the following:    Squamous Epithelial / LPF 6-30 (*)    Bacteria, UA MANY (*)    All other components  within normal limits  GC/CHLAMYDIA PROBE AMP ([place]) NOT AT [facility]    Imaging Review No results found. I have personally reviewed and evaluated these images and lab results as part of my medical decision-making.   EKG Interpretation None      MDM   Final diagnoses:  Threatened miscarriage in early pregnancy   Pt here for evaluation of vaginal bleeding, approximately [redacted] weeks pregnant by first trimester ultrasound.  Examination with no acute distress, benign abdominal examination, no active bleeding on vaginal examination. Discussed with patient first trimester vaginal bleeding and threatened miscarriage. Discussed wound care and return precautions. UA is c/w for bacteriuria of pregnancy, treating with Keflex.  I personally performed the services described in this documentation, which was scribed in my presence. The recorded information has been reviewed and is accurate.    Tilden FossaElizabeth Zanyiah Posten, MD 07/31/15 0010

## 2015-07-30 NOTE — ED Notes (Signed)
Pt states she is [redacted] weeks pregnant and 1 1/2 weeks ago Had cramping and  Dx with slight tear. No bleeding. Today has low cramping and noticed blood on tissue when she wipes.

## 2015-07-30 NOTE — Discharge Instructions (Signed)

## 2015-08-04 ENCOUNTER — Encounter (HOSPITAL_BASED_OUTPATIENT_CLINIC_OR_DEPARTMENT_OTHER): Payer: Self-pay | Admitting: *Deleted

## 2015-08-04 ENCOUNTER — Emergency Department (HOSPITAL_BASED_OUTPATIENT_CLINIC_OR_DEPARTMENT_OTHER)
Admission: EM | Admit: 2015-08-04 | Discharge: 2015-08-04 | Disposition: A | Discharge status: Home / Self Care | Payer: Self-pay | Attending: Emergency Medicine | Admitting: Emergency Medicine

## 2015-08-04 DIAGNOSIS — Z8742 Personal history of other diseases of the female genital tract: Secondary | ICD-10-CM | POA: Insufficient documentation

## 2015-08-04 DIAGNOSIS — Z8639 Personal history of other endocrine, nutritional and metabolic disease: Secondary | ICD-10-CM | POA: Insufficient documentation

## 2015-08-04 DIAGNOSIS — O209 Hemorrhage in early pregnancy, unspecified: Secondary | ICD-10-CM | POA: Insufficient documentation

## 2015-08-04 DIAGNOSIS — Z3A08 8 weeks gestation of pregnancy: Secondary | ICD-10-CM | POA: Insufficient documentation

## 2015-08-04 DIAGNOSIS — Z792 Long term (current) use of antibiotics: Secondary | ICD-10-CM | POA: Insufficient documentation

## 2015-08-04 DIAGNOSIS — Z87440 Personal history of urinary (tract) infections: Secondary | ICD-10-CM | POA: Insufficient documentation

## 2015-08-04 LAB — WET PREP, GENITAL
Clue Cells Wet Prep HPF POC: NONE SEEN
SPERM: NONE SEEN
Trich, Wet Prep: NONE SEEN
Yeast Wet Prep HPF POC: NONE SEEN

## 2015-08-04 LAB — URINALYSIS, ROUTINE W REFLEX MICROSCOPIC
BILIRUBIN URINE: NEGATIVE
Glucose, UA: NEGATIVE mg/dL
KETONES UR: NEGATIVE mg/dL
LEUKOCYTES UA: NEGATIVE
NITRITE: NEGATIVE
PROTEIN: NEGATIVE mg/dL
Specific Gravity, Urine: 1.013 (ref 1.005–1.030)
pH: 6 (ref 5.0–8.0)

## 2015-08-04 LAB — URINE MICROSCOPIC-ADD ON

## 2015-08-04 MED ORDER — ACETAMINOPHEN 325 MG PO TABS
650.0000 mg | ORAL_TABLET | Freq: Once | ORAL | Status: AC
  Administered 2015-08-04: 650 mg via ORAL
  Filled 2015-08-04: qty 2

## 2015-08-04 NOTE — ED Notes (Signed)
Vaginal bleeding since this am. She is [redacted] weeks pregnant. crampy pain. Bright red.

## 2015-08-04 NOTE — ED Notes (Signed)
PA at bedside.

## 2015-08-04 NOTE — Discharge Instructions (Signed)
Take Tylenol for cramping. Pelvic rest. Follow up closely with OB/GYN.    Vaginal Bleeding During Pregnancy, First Trimester A small amount of bleeding (spotting) from the vagina is relatively common in early pregnancy. It usually stops on its own. Various things may cause bleeding or spotting in early pregnancy. Some bleeding may be related to the pregnancy, and some may not. In most cases, the bleeding is normal and is not a problem. However, bleeding can also be a sign of something serious. Be sure to tell your health care provider about any vaginal bleeding right away. Some possible causes of vaginal bleeding during the first trimester include:  Infection or inflammation of the cervix.  Growths (polyps) on the cervix.  Miscarriage or threatened miscarriage.  Pregnancy tissue has developed outside of the uterus and in a fallopian tube (tubal pregnancy).  Tiny cysts have developed in the uterus instead of pregnancy tissue (molar pregnancy). HOME CARE INSTRUCTIONS  Watch your condition for any changes. The following actions may help to lessen any discomfort you are feeling:  Follow your health care provider's instructions for limiting your activity. If your health care provider orders bed rest, you may need to stay in bed and only get up to use the bathroom. However, your health care provider may allow you to continue light activity.  If needed, make plans for someone to help with your regular activities and responsibilities while you are on bed rest.  Keep track of the number of pads you use each day, how often you change pads, and how soaked (saturated) they are. Write this down.  Do not use tampons. Do not douche.  Do not have sexual intercourse or orgasms until approved by your health care provider.  If you pass any tissue from your vagina, save the tissue so you can show it to your health care provider.  Only take over-the-counter or prescription medicines as directed by your  health care provider.  Do not take aspirin because it can make you bleed.  Keep all follow-up appointments as directed by your health care provider. SEEK MEDICAL CARE IF:  You have any vaginal bleeding during any part of your pregnancy.  You have cramps or labor pains.  You have a fever, not controlled by medicine. SEEK IMMEDIATE MEDICAL CARE IF:   You have severe cramps in your back or belly (abdomen).  You pass large clots or tissue from your vagina.  Your bleeding increases.  You feel light-headed or weak, or you have fainting episodes.  You have chills.  You are leaking fluid or have a gush of fluid from your vagina.  You pass out while having a bowel movement. MAKE SURE YOU:  Understand these instructions.  Will watch your condition.  Will get help right away if you are not doing well or get worse.   This information is not intended to replace advice given to you by your health care provider. Make sure you discuss any questions you have with your health care provider.   Document Released: 05/02/2005 Document Revised: 07/28/2013 Document Reviewed: 03/30/2013 Elsevier Interactive Patient Education 2016 Elsevier Inc.  Pelvic Rest Pelvic rest is sometimes recommended for women when:   The placenta is partially or completely covering the opening of the cervix (placenta previa).  There is bleeding between the uterine wall and the amniotic sac in the first trimester (subchorionic hemorrhage).  The cervix begins to open without labor starting (incompetent cervix, cervical insufficiency).  The labor is too early (preterm labor). HOME  CARE INSTRUCTIONS  Do not have sexual intercourse, stimulation, or an orgasm.  Do not use tampons, douche, or put anything in the vagina.  Do not lift anything over 10 pounds (4.5 kg).  Avoid strenuous activity or straining your pelvic muscles. SEEK MEDICAL CARE IF:  You have any vaginal bleeding during pregnancy. Treat this as  a potential emergency.  You have cramping pain felt low in the stomach (stronger than menstrual cramps).  You notice vaginal discharge (watery, mucus, or bloody).  You have a low, dull backache.  There are regular contractions or uterine tightening. SEEK IMMEDIATE MEDICAL CARE IF: You have vaginal bleeding and have placenta previa.    This information is not intended to replace advice given to you by your health care provider. Make sure you discuss any questions you have with your health care provider.   Document Released: 11/17/2010 Document Revised: 10/15/2011 Document Reviewed: 01/24/2015 Elsevier Interactive Patient Education [employer]

## 2015-08-04 NOTE — ED Notes (Signed)
Pelvic cart at bedside. 

## 2015-08-04 NOTE — ED Provider Notes (Signed)
CSN: [id number]     Arrival date & time 08/04/15  1530 History   First MD Initiated Contact with Patient 08/04/15 1740     Chief Complaint  Patient presents with  . Vaginal Bleeding  . [redacted] Weeks Pregnant     (Consider location/radiation/quality/duration/timing/severity/associated sxs/prior Treatment) HPI Janice Graham is a 29 y.o. female with history of UTIs, presents to emergency department complaining of vaginal bleeding. Patient is G5 P2A2, currently approximately [redacted] weeks pregnant, states that she woke up this morning and had a gush of bright red blood, "running down my legs." Patient states she has had bleeding with this pregnancy on an off. States she has been seen here several times and reports that each time she is told that she may be having a miscarriage. She states she is also having some abdominal cramping which also started this morning. She denies any nausea or vomiting. Denies any fever. She reports some clots. She states bleeding seems to be slowing down but cramping is still there. Urinary symptoms. Did not take any medications prior to coming in. Patient does not have OB/GYN. Patient states her blood type is O+.  Past Medical History  Diagnosis Date  . Goiter   . UTI (lower urinary tract infection)   . BV (bacterial vaginosis)    Past Surgical History  Procedure Laterality Date  . Tonsillectomy    . Wisdom tooth extraction     No family history on file. Social History  Substance Use Topics  . Smoking status: Never Smoker   . Smokeless tobacco: Never Used  . Alcohol Use: No   OB History    Gravida Para Term Preterm AB TAB SAB Ectopic Multiple Living   1              Review of Systems  Constitutional: Negative for fever and chills.  Respiratory: Negative for cough, chest tightness and shortness of breath.   Cardiovascular: Negative for chest pain, palpitations and leg swelling.  Gastrointestinal: Positive for abdominal pain. Negative for nausea, vomiting and  diarrhea.  Genitourinary: Positive for vaginal bleeding and pelvic pain. Negative for dysuria, flank pain, vaginal discharge and vaginal pain.  Musculoskeletal: Negative for myalgias, arthralgias, neck pain and neck stiffness.  Skin: Negative for rash.  Neurological: Negative for dizziness, weakness and headaches.  All other systems reviewed and are negative.     Allergies  Review of patient's allergies indicates no known allergies.  Home Medications   Prior to Admission medications   Medication Sig Start Date End Date Taking? Authorizing Provider  cephALEXin (KEFLEX) 500 MG capsule Take 1 capsule (500 mg total) by mouth 2 (two) times daily. 07/30/15   Tilden FossaElizabeth Rees, MD  metoCLOPramide (REGLAN) 10 MG tablet Take 1 tablet (10 mg total) by mouth every 6 (six) hours. 07/30/15   Tilden FossaElizabeth Rees, MD   BP 102/71 mmHg  Pulse 73  Temp(Src) 98.6 F (37 C) (Oral)  Resp 18  Ht 5\' 2"  (1.575 m)  Wt 62.143 kg  BMI 25.05 kg/m2  SpO2 100%  LMP 05/07/2015 Physical Exam  Constitutional: She appears well-developed and well-nourished. No distress.  HENT:  Head: Normocephalic.  Eyes: Conjunctivae are normal.  Neck: Neck supple.  Cardiovascular: Normal rate, regular rhythm and normal heart sounds.   Pulmonary/Chest: Effort normal and breath sounds normal. No respiratory distress. She has no wheezes. She has no rales.  Abdominal: Soft. Bowel sounds are normal. She exhibits no distension. There is tenderness. There is no rebound.  Suprapubic tenderness  Genitourinary:  Normal external genitalia. Small blood clots in vaginal canal. No active bright red bleeding. Cervix is closed.  Musculoskeletal: She exhibits no edema.  Neurological: She is alert.  Skin: Skin is warm and dry.  Psychiatric: She has a normal mood and affect. Her behavior is normal.  Nursing note and vitals reviewed.   ED Course  Procedures (including critical care time) Labs Review Labs Reviewed  URINALYSIS, ROUTINE W  REFLEX MICROSCOPIC (NOT AT [facility]) - Abnormal; Notable for the following:    Hgb urine dipstick LARGE (*)    All other components within normal limits  URINE MICROSCOPIC-ADD ON - Abnormal; Notable for the following:    Squamous Epithelial / LPF 0-5 (*)    Bacteria, UA RARE (*)    All other components within normal limits    Imaging Review No results found. I have personally reviewed and evaluated these images and lab results as part of my medical decision-making.   EKG Interpretation None      MDM   Final diagnoses:  Vaginal bleeding in pregnancy, first trimester    patient's bleeding seems to be slowing down. Only clots in vaginal canal. Cervix is closed. A bedside ultrasound showed a live fetus with heart rate in 150s. [place] from outside records on 12/14 showed subchorionic hemorrhage, otherwise normal intrauterine pregnancy.   7:08 PM Pt monitored. Bleeding resolved. i do not think pt needs any imaging at this time given known IUP. Explained pt is under threatened abortion. Pelvic rest. Follow up with OB/GYN.   Filed Vitals:   08/04/15 1551 08/04/15 1834  BP: 102/71 100/69  Pulse: 73 74  Temp: 98.6 F (37 C)   TempSrc: Oral   Resp: 18 16  Height:  (1.575 m)   Weight: 62.143 kg   SpO2: 100% 100%        Jaynie Crumble, PA-C 08/04/15 2159  Tilden Fossa, MD 08/05/15 [phone]

## 2015-08-05 LAB — GC/CHLAMYDIA PROBE AMP (~~LOC~~) NOT AT ARMC
Chlamydia: NEGATIVE
NEISSERIA GONORRHEA: NEGATIVE

## 2015-08-06 LAB — GC/CHLAMYDIA PROBE AMP (~~LOC~~) NOT AT ARMC
Chlamydia: NEGATIVE
Neisseria Gonorrhea: NEGATIVE

## 2015-09-09 ENCOUNTER — Other Ambulatory Visit (HOSPITAL_COMMUNITY): Payer: Self-pay | Admitting: Specialist

## 2015-09-09 DIAGNOSIS — O289 Unspecified abnormal findings on antenatal screening of mother: Secondary | ICD-10-CM

## 2015-09-14 ENCOUNTER — Ambulatory Visit (HOSPITAL_COMMUNITY)
Admission: RE | Admit: 2015-09-14 | Discharge: 2015-09-14 | Disposition: A | Discharge status: Home / Self Care | Payer: Medicaid Other | Source: Ambulatory Visit | Attending: Specialist | Admitting: Specialist

## 2015-09-14 ENCOUNTER — Encounter (HOSPITAL_COMMUNITY): Payer: Self-pay

## 2015-09-14 DIAGNOSIS — Z3A15 15 weeks gestation of pregnancy: Secondary | ICD-10-CM

## 2015-09-14 DIAGNOSIS — O289 Unspecified abnormal findings on antenatal screening of mother: Secondary | ICD-10-CM

## 2015-09-14 NOTE — Progress Notes (Signed)
Genetic Counseling   DOB: 03/05/1986 Referring Provider: Alm Bustard, MD Appointment Date: 09/14/2015 Attending: Dr. Alpha Gula   Janice Graham and her partner, Janice Graham, were seen for consultation for genetic counseling because of an elevated MSAFP of 2.85 MoMs based on maternal serum screening through [employer].    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   We reviewed ONTDs, the typical multifactorial etiology, and variable prognosis.  In addition, we discussed additional explanations for an elevated MSAFP including: normal variation, twins, feto-maternal bleeding, a gestational dating error, abdominal wall defects, kidney differences, oligohydramnios, and placental problems.  We discussed that an unexplained elevation of MSAFP is associated with an increased risk for third trimester complications including: prematurity, low birth weight, and pre-eclampsia.  We discussed that the MShCG was also significantly elevated at 10.27 MoM, but this, alone, is not associated with any specific fetal conditions, but could suggest a placental problem.  They were counseled that the Quad screen is able to adjust the age related risk for fetal Down syndrome and Trisomy 68.  While the analyte pattern that is typically associated with Down syndrome was present (with the  exception of the AFP which was elevated) her risk for Down syndrome was reduced from her age related risk.  Thus it is unclear what significance, if any, there is of the other unusual analyte values.  We reviewed additional available screening and diagnostic options including, repeat Quad screening, noninvasive prenatal screening/cell free DNA screening (NIPS) and detailed ultrasound.  We discussed the risks, limitations, and benefits of each.  After thoughtful consideration of these options, Janice Graham elected to return for anatomy ultrasound at the appropriate gestation.  She understands that ultrasound cannot rule out all birth defects or genetic syndromes.  However, she was counseled that 80-90% of fetuses with ONTDs can be detected by detailed 2nd trimester ultrasound, when well visualized.   Janice Graham was provided with written information regarding sickle cell anemia (SCA) including the carrier frequency and incidence in the African American population, the availability of carrier testing and prenatal diagnosis if indicated.  In addition, we discussed that hemoglobinopathies are routinely screened for as part of the [facility] newborn screening panel.  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  Janice Graham stated that he believed he is not a carrier for SCA.  They were counseled that when both parents are carriers of sickle cell anemia, there is a 1 in 4 chance with each pregnancy for the baby to have sickle cell disease.  However, if Janice Graham does not have a hemoglobin variant, than it would be unlikely for their child to have a clinically significant hemoglobinopathy.  Both family histories were reviewed.  Janice Graham reported that he had a son, from another relationship, who died about an hour after birth.  He  could not state what was different about that baby or why he died.  He became quite distraught and left the office.  Janice Graham was uncertain why the child died, but thinks there may have been  an issue regarding the life of the mother.  Janice Graham reported that he had seizures as a child, but it may have been related to prenatal illicit drug exposure.  He is now seizure free and does not take medication for seizure control.   The remainder of the family histories were reviewed and found to be noncontributory for birth defects, intellectual disability, and known genetic conditions.  Without further information regarding the provided family history, an accurate genetic risk cannot be calculated. Further genetic counseling is warranted if more information is obtained.  Janice Graham denied exposure to environmental toxins or chemical agents. She denied the use of alcohol, tobacco or street drugs. She denied significant viral illnesses during the course of her pregnancy. Her medical and surgical histories were noncontributory.   I counseled this couple for approximately 45 minutes regarding the above risks and available options.    Mady Gemma, MS,  Certified Genetic Counselor

## 2015-09-17 ENCOUNTER — Encounter (HOSPITAL_BASED_OUTPATIENT_CLINIC_OR_DEPARTMENT_OTHER): Payer: Self-pay | Admitting: Emergency Medicine

## 2015-09-17 ENCOUNTER — Emergency Department (HOSPITAL_BASED_OUTPATIENT_CLINIC_OR_DEPARTMENT_OTHER)
Admission: EM | Admit: 2015-09-17 | Discharge: 2015-09-17 | Disposition: A | Discharge status: Home / Self Care | Payer: Medicaid Other | Attending: Emergency Medicine | Admitting: Emergency Medicine

## 2015-09-17 DIAGNOSIS — Z87440 Personal history of urinary (tract) infections: Secondary | ICD-10-CM | POA: Diagnosis not present

## 2015-09-17 DIAGNOSIS — R05 Cough: Secondary | ICD-10-CM | POA: Diagnosis not present

## 2015-09-17 DIAGNOSIS — Z3A12 12 weeks gestation of pregnancy: Secondary | ICD-10-CM | POA: Diagnosis not present

## 2015-09-17 DIAGNOSIS — M2550 Pain in unspecified joint: Secondary | ICD-10-CM | POA: Insufficient documentation

## 2015-09-17 DIAGNOSIS — R509 Fever, unspecified: Secondary | ICD-10-CM | POA: Diagnosis not present

## 2015-09-17 DIAGNOSIS — Z792 Long term (current) use of antibiotics: Secondary | ICD-10-CM | POA: Insufficient documentation

## 2015-09-17 DIAGNOSIS — H53149 Visual discomfort, unspecified: Secondary | ICD-10-CM | POA: Insufficient documentation

## 2015-09-17 DIAGNOSIS — R110 Nausea: Secondary | ICD-10-CM | POA: Diagnosis not present

## 2015-09-17 DIAGNOSIS — R69 Illness, unspecified: Secondary | ICD-10-CM

## 2015-09-17 DIAGNOSIS — O9989 Other specified diseases and conditions complicating pregnancy, childbirth and the puerperium: Secondary | ICD-10-CM | POA: Diagnosis not present

## 2015-09-17 DIAGNOSIS — R000 Tachycardia, unspecified: Secondary | ICD-10-CM | POA: Diagnosis not present

## 2015-09-17 DIAGNOSIS — R0982 Postnasal drip: Secondary | ICD-10-CM | POA: Diagnosis not present

## 2015-09-17 DIAGNOSIS — Z8619 Personal history of other infectious and parasitic diseases: Secondary | ICD-10-CM | POA: Diagnosis not present

## 2015-09-17 DIAGNOSIS — J111 Influenza due to unidentified influenza virus with other respiratory manifestations: Secondary | ICD-10-CM

## 2015-09-17 DIAGNOSIS — R519 Headache, unspecified: Secondary | ICD-10-CM

## 2015-09-17 DIAGNOSIS — Z8639 Personal history of other endocrine, nutritional and metabolic disease: Secondary | ICD-10-CM | POA: Insufficient documentation

## 2015-09-17 DIAGNOSIS — R51 Headache: Secondary | ICD-10-CM | POA: Insufficient documentation

## 2015-09-17 MED ORDER — METOCLOPRAMIDE HCL 5 MG/ML IJ SOLN
10.0000 mg | Freq: Once | INTRAMUSCULAR | Status: AC
  Administered 2015-09-17: 10 mg via INTRAVENOUS
  Filled 2015-09-17: qty 2

## 2015-09-17 MED ORDER — CETIRIZINE HCL 10 MG PO TABS: 10.0000 mg | ORAL_TABLET | Freq: Every day | ORAL | Status: DC

## 2015-09-17 MED ORDER — SODIUM CHLORIDE 0.9 % IV BOLUS (SEPSIS)
1000.0000 mL | Freq: Once | INTRAVENOUS | Status: AC
  Administered 2015-09-17: 1000 mL via INTRAVENOUS

## 2015-09-17 MED ORDER — ACETAMINOPHEN 325 MG PO TABS
650.0000 mg | ORAL_TABLET | Freq: Once | ORAL | Status: AC
  Administered 2015-09-17: 650 mg via ORAL
  Filled 2015-09-17: qty 2

## 2015-09-17 MED ORDER — DIPHENHYDRAMINE HCL 50 MG/ML IJ SOLN
25.0000 mg | Freq: Once | INTRAMUSCULAR | Status: AC
  Administered 2015-09-17: 25 mg via INTRAVENOUS
  Filled 2015-09-17: qty 1

## 2015-09-17 NOTE — ED Notes (Signed)
Pt c/o HA, all over rates pain a 10 on 0-1 scale

## 2015-09-17 NOTE — ED Provider Notes (Signed)
CSN: [id number]     Arrival date & time 09/17/15  1235 History   First MD Initiated Contact with Patient 09/17/15 1250     Chief Complaint  Patient presents with  . Headache   Janice Graham is a 30 y.o. female who is [redacted] weeks pregnant who presents to the emergency department complaining of flulike symptoms since yesterday. Patient reports a generalized headache, cough, postnasal drip, nausea and generalized body aches. Patient reports she has been nauseated since her pregnancy. Patient is followed by OB/GYN Dr. Arther Abbott. She's been taking Tylenol without relief. Patient reports her headache is generalized and feels like her typical migraines. She notes associated photophobia. She reports subjective fever yesterday. She denies vomiting, diarrhea, abdominal pain, vaginal bleeding, vaginal discharge, wheezing, trouble breathing, numbness, tingling, weakness, changes to her vision or ear pain.    Patient is a 30 y.o. female presenting with headaches. The history is provided by the patient. No language interpreter was used.  Headache Associated symptoms: cough, drainage, fever (subjective fever), myalgias, nausea and photophobia   Associated symptoms: no abdominal pain, no back pain, no congestion, no diarrhea, no dizziness, no eye pain, no neck pain, no neck stiffness, no numbness, no sore throat, no vomiting and no weakness     Past Medical History  Diagnosis Date  . Goiter   . UTI (lower urinary tract infection)   . BV (bacterial vaginosis)    Past Surgical History  Procedure Laterality Date  . Tonsillectomy    . Wisdom tooth extraction     No family history on file. Social History  Substance Use Topics  . Smoking status: Never Smoker   . Smokeless tobacco: Never Used  . Alcohol Use: No   OB History    Gravida Para Term Preterm AB TAB SAB Ectopic Multiple Living   1              Review of Systems  Constitutional: Positive for fever (subjective fever).  HENT: Positive for  postnasal drip. Negative for congestion, sore throat and trouble swallowing.   Eyes: Positive for photophobia. Negative for pain and visual disturbance.  Respiratory: Positive for cough. Negative for shortness of breath and wheezing.   Cardiovascular: Negative for chest pain and palpitations.  Gastrointestinal: Positive for nausea. Negative for vomiting, abdominal pain and diarrhea.  Genitourinary: Negative for dysuria, urgency, frequency, hematuria, vaginal bleeding, vaginal discharge and difficulty urinating.  Musculoskeletal: Positive for myalgias and arthralgias. Negative for back pain, neck pain and neck stiffness.  Skin: Negative for rash.  Neurological: Positive for headaches. Negative for dizziness, syncope, weakness, light-headedness and numbness.      Allergies  Review of patient's allergies indicates no known allergies.  Home Medications   Prior to Admission medications   Medication Sig Start Date End Date Taking? Authorizing Provider  cephALEXin (KEFLEX) 500 MG capsule Take 1 capsule (500 mg total) by mouth 2 (two) times daily. 07/30/15   Tilden Fossa, MD  cetirizine (ZYRTEC ALLERGY) 10 MG tablet Take 1 tablet (10 mg total) by mouth daily. 09/17/15   Everlene Farrier, PA-C  metoCLOPramide (REGLAN) 10 MG tablet Take 1 tablet (10 mg total) by mouth every 6 (six) hours. 07/30/15   Tilden Fossa, MD   BP 104/66 mmHg  Pulse 108  Temp(Src) 98.7 F (37.1 C) (Oral)  Resp 18  Ht  (1.575 m)  Wt 63.504 kg  BMI 25.60 kg/m2  SpO2 100% Physical Exam  Constitutional: She is oriented to person, place, and time. She  appears well-developed and well-nourished. No distress.  Nontoxic appearing.  HENT:  Head: Normocephalic and atraumatic.  Right Ear: External ear normal.  Left Ear: External ear normal.  Mouth/Throat: Oropharynx is clear and moist. No oropharyngeal exudate.  Boggy nasal turbinates bilaterally. Bilateral tympanic membranes are pearly-gray without erythema or loss  of landmarks.  No temporal edema or tenderness. No tonsillar hypertrophy or exudate.  Eyes: Conjunctivae and EOM are normal. Pupils are equal, round, and reactive to light. Right eye exhibits no discharge. Left eye exhibits no discharge.  Neck: Normal range of motion. Neck supple. No JVD present. No tracheal deviation present.  Cardiovascular: Normal rate, regular rhythm, normal heart sounds and intact distal pulses.  Exam reveals no gallop and no friction rub.   No murmur heard. HR 108.   Pulmonary/Chest: Effort normal and breath sounds normal. No respiratory distress. She has no wheezes. She has no rales.  Lungs clear to auscultation bilaterally.  Abdominal: Soft. Bowel sounds are normal. There is no tenderness. There is no guarding.  Abdomen is soft and nontender to palpation. Bowel sounds are present. Patient's fundal height is beneath her umbilicus.  Musculoskeletal: She exhibits no edema.  Lymphadenopathy:    She has no cervical adenopathy.  Neurological: She is alert and oriented to person, place, and time. No cranial nerve deficit. Coordination normal.  The patient is alert and oriented 3. Cranial nerves are intact. Sensation is intact to bilateral upper and lower extremities. No pronator drift. Speech is clear and coherent. Finger to nose intact bilaterally.  Skin: Skin is warm and dry. No rash noted. She is not diaphoretic. No erythema. No pallor.  Psychiatric: She has a normal mood and affect. Her behavior is normal.  Nursing note and vitals reviewed.   ED Course  Procedures (including critical care time) Labs Review Labs Reviewed - No data to display  Imaging Review No results found. .   EKG Interpretation None      Filed Vitals:   09/17/15 1242 09/17/15 1459 09/17/15 1541  BP: 102/67 107/68 104/66  Pulse: 115 112 108  Temp: 98.7 F (37.1 C)    TempSrc: Oral    Resp: Height:  (1.575 m)    Weight: 63.504 kg    SpO2: 100% 100% 100%     MDM    Meds given in ED:  Medications  metoCLOPramide (REGLAN) injection 10 mg (10 mg Intravenous Given 09/17/15 1331)  diphenhydrAMINE (BENADRYL) injection 25 mg (25 mg Intravenous Given 09/17/15 1331)  sodium chloride 0.9 % bolus 1,000 mL (0 mLs Intravenous Stopped 09/17/15 1432)  sodium chloride 0.9 % bolus 1,000 mL (0 mLs Intravenous Stopped 09/17/15 1615)  acetaminophen (TYLENOL) tablet 650 mg (650 mg Oral Given 09/17/15 1458)    Discharge Medication List as of 09/17/2015  4:11 PM    START taking these medications   Details  cetirizine (ZYRTEC ALLERGY) 10 MG tablet Take 1 tablet (10 mg total) by mouth daily., Starting 09/17/2015, Until Discontinued, Print        Final diagnoses:  Influenza-like illness  Bad headache   This is a 29 y.o. female who is [redacted] weeks pregnant who presents to the emergency department complaining of flulike symptoms since yesterday. Patient reports a generalized headache, cough, postnasal drip, nausea and generalized body aches. Patient reports she has been nauseated since her pregnancy. Patient is followed by OB/GYN Dr. Arther Abbott. She's been taking Tylenol without relief. Patient reports her headache is generalized and feels like her  typical migraines. She denies fevers. She denies abdominal pain, vomiting, or diarrhea. On exam the patient is afebrile nontoxic appearing. She has no focal neurological deficits. Her lungs are clear to auscultation bilaterally. Her abdomen is soft and nontender to palpation. Will provide with fluid bolus, Reglan and Benadryl. I discussed that Reglan is a category B medication and patient is comfortable with this medication.  At reevaluation, patient reports her headache is down to a 2 out of 10. Patient is still mildly tachycardic. She denies feeling lightheaded with position change. Will provide with second fluid bolus and Tylenol and reevaluate. At second reevaluation patient's heart rate has improved. She still mildly tachycardic at 108.  Patient is able to stand and does not feel lightheaded with position change or walking. She reports feeling ready for discharge. Will discharge with rx for Zyrtec and I encouraged her to use Tylenol for body aches. I suspect influenza-like illness. I encouraged close follow-up by her primary care provider and her OB/GYN. I discussed strict and specific return precautions with the patient. I advised the patient to follow-up with their primary care provider this week. I advised the patient to return to the emergency department with new or worsening symptoms or new concerns. The patient verbalized understanding and agreement with plan.       Everlene Farrier, PA-C 09/17/15 1718  Melene Plan, DO 09/18/15 [phone]

## 2015-09-17 NOTE — ED Notes (Signed)
Presents today with "flu like feeling", HA, body aches, onset yesterday approx 5pm. No vomiting or diarrhea. States she is [redacted] weeks pregnant. G3P2A0. Has primary OB MD

## 2015-09-17 NOTE — Discharge Instructions (Signed)
Influenza, Adult °Influenza ("the flu") is a viral infection of the respiratory tract. It occurs more often in winter months because people spend more time in close contact with one another. Influenza can make you feel very sick. Influenza easily spreads from person to person (contagious). °CAUSES  °Influenza is caused by a virus that infects the respiratory tract. You can catch the virus by breathing in droplets from an infected person's cough or sneeze. You can also catch the virus by touching something that was recently contaminated with the virus and then touching your mouth, nose, or eyes. °RISKS AND COMPLICATIONS °You may be at risk for a more severe case of influenza if you smoke cigarettes, have diabetes, have chronic heart disease (such as heart failure) or lung disease (such as asthma), or if you have a weakened immune system. Elderly people and pregnant women are also at risk for more serious infections. The most common problem of influenza is a lung infection (pneumonia). Sometimes, this problem can require emergency medical care and may be life threatening. °SIGNS AND SYMPTOMS  °Symptoms typically last 4 to 10 days and may include: °· Fever. °· Chills. °· Headache, body aches, and muscle aches. °· Sore throat. °· Chest discomfort and cough. °· Poor appetite. °· Weakness or feeling tired. °· Dizziness. °· Nausea or vomiting. °DIAGNOSIS  °Diagnosis of influenza is often made based on your history and a physical exam. A nose or throat swab test can be done to confirm the diagnosis. °TREATMENT  °In mild cases, influenza goes away on its own. Treatment is directed at relieving symptoms. For more severe cases, your health care provider may prescribe antiviral medicines to shorten the sickness. Antibiotic medicines are not effective because the infection is caused by a virus, not by bacteria. °HOME CARE INSTRUCTIONS °· Take medicines only as directed by your health care provider. °· Use a cool mist humidifier  to make breathing easier. °· Get plenty of rest until your temperature returns to normal. This usually takes 3 to 4 days. °· Drink enough fluid to keep your urine clear or pale yellow. °· Cover your mouth and nose when coughing or sneezing, and wash your hands well to prevent the virus from spreading. °· Stay home from work or school until the fever is gone for at least 1 full day. °PREVENTION  °An annual influenza vaccination (flu shot) is the best way to avoid getting influenza. An annual flu shot is now routinely recommended for all adults in the [place] °SEEK MEDICAL CARE IF: °· You experience chest pain, your cough worsens, or you produce more mucus. °· You have nausea, vomiting, or diarrhea. °· Your fever returns or gets worse. °SEEK IMMEDIATE MEDICAL CARE IF: °· You have trouble breathing, you become short of breath, or your skin or nails become bluish. °· You have severe pain or stiffness in the neck. °· You develop a sudden headache, or pain in the face or ear. °· You have nausea or vomiting that you cannot control. °MAKE SURE YOU:  °· Understand these instructions. °· Will watch your condition. °· Will get help right away if you are not doing well or get worse. °  °This information is not intended to replace advice given to you by your health care provider. Make sure you discuss any questions you have with your health care provider. °  °Document Released: 07/20/2000 Document Revised: 08/13/2014 Document Reviewed: 10/22/2011 °Elsevier Interactive Patient Education ©2016 [employer] ° °General Headache Without Cause °A headache is pain or discomfort felt around the head or neck area. The specific cause of a headache may not be found. There are many causes and types of   A few common ones are:  Tension headaches.  Migraine headaches.  Cluster headaches.  Chronic daily headaches. HOME CARE INSTRUCTIONS  Watch your condition for any changes. Take these steps to help with your  condition: Managing Pain  Take over-the-counter and prescription medicines only as told by your health care provider.  Lie down in a dark, quiet room when you have a headache.  If directed, apply ice to the head and neck area:  Put ice in a plastic bag.  Place a towel between your skin and the bag.  Leave the ice on for 20 minutes, 2-3 times per day.  Use a heating pad or hot shower to apply heat to the head and neck area as told by your health care provider.  Keep lights dim if bright lights bother you or make your headaches worse. Eating and Drinking  Eat meals on a regular schedule.  Limit alcohol use.  Decrease the amount of caffeine you drink, or stop drinking caffeine. General Instructions  Keep all follow-up visits as told by your health care provider. This is important.  Keep a headache journal to help find out what may trigger your headaches. For example, write down:  What you eat and drink.  How much sleep you get.  Any change to your diet or medicines.  Try massage or other relaxation techniques.  Limit stress.  Sit up straight, and do not tense your muscles.  Do not use tobacco products, including cigarettes, chewing tobacco, or e-cigarettes. If you need help quitting, ask your health care provider.  Exercise regularly as told by your health care provider.  Sleep on a regular schedule. Get 7-9 hours of sleep, or the amount recommended by your health care provider. SEEK MEDICAL CARE IF:   Your symptoms are not helped by medicine.  You have a headache that is different from the usual headache.  You have nausea or you vomit.  You have a fever. SEEK IMMEDIATE MEDICAL CARE IF:   Your headache becomes severe.  You have repeated vomiting.  You have a stiff neck.  You have a loss of vision.  You have problems with speech.  You have pain in the eye or ear.  You have muscular weakness or loss of muscle control.  You lose your balance or have  trouble walking.  You feel faint or pass out.  You have confusion.   This information is not intended to replace advice given to you by your health care provider. Make sure you discuss any questions you have with your health care provider.   Document Released: 07/23/2005 Document Revised: 04/13/2015 Document Reviewed: 11/15/2014 Elsevier Interactive Patient Education [employer]

## 2015-09-17 NOTE — ED Notes (Signed)
Pt c/o headache since yesterday, along with body aches and cough.  Pt states she is 4 months pregnant and nauseous, but has been nauseous throughout pregnancy thus far.

## 2015-09-17 NOTE — ED Notes (Signed)
1 liter NS bolus initiated.  ?

## 2015-10-04 ENCOUNTER — Ambulatory Visit (HOSPITAL_COMMUNITY)
Admission: RE | Admit: 2015-10-04 | Discharge: 2015-10-04 | Disposition: A | Discharge status: Home / Self Care | Payer: Medicaid Other | Source: Ambulatory Visit | Attending: Specialist | Admitting: Specialist

## 2015-10-04 ENCOUNTER — Other Ambulatory Visit (HOSPITAL_COMMUNITY): Payer: Self-pay | Admitting: Specialist

## 2015-10-04 VITALS — BP 117/70 | HR 84 | Wt 137.4 lb

## 2015-10-04 DIAGNOSIS — O289 Unspecified abnormal findings on antenatal screening of mother: Secondary | ICD-10-CM

## 2015-10-04 DIAGNOSIS — Z363 Encounter for antenatal screening for malformations: Secondary | ICD-10-CM

## 2015-10-04 DIAGNOSIS — O283 Abnormal ultrasonic finding on antenatal screening of mother: Secondary | ICD-10-CM | POA: Diagnosis present

## 2015-10-04 DIAGNOSIS — O09899 Supervision of other high risk pregnancies, unspecified trimester: Secondary | ICD-10-CM

## 2015-10-04 DIAGNOSIS — O280 Abnormal hematological finding on antenatal screening of mother: Secondary | ICD-10-CM

## 2015-10-04 DIAGNOSIS — Z1389 Encounter for screening for other disorder: Secondary | ICD-10-CM

## 2015-10-04 DIAGNOSIS — Z3A18 18 weeks gestation of pregnancy: Secondary | ICD-10-CM

## 2015-10-11 ENCOUNTER — Other Ambulatory Visit (HOSPITAL_COMMUNITY): Payer: Medicaid Other

## 2015-11-09 IMAGING — CT CT ABD-PELV W/ CM
2 of 4 series · 16 of 46 positions shown, 18 images · IV contrast (APPLIED)
Comparison: CT Abdomen and Pelvis 03/18/2014.

CLINICAL DATA: 28-year-old female with acute nausea vomiting and
pelvic pain. Bilateral lower extremity pain. Initial encounter.

EXAM:
CT ABDOMEN AND PELVIS WITH CONTRAST
TECHNIQUE: Multidetector CT imaging of the abdomen and pelvis was performed
using the standard protocol following bolus administration of
intravenous contrast.
CONTRAST:  100mL OMNIPAQUE IOHEXOL 300 MG/ML  SOLN

[Series 2: abd/pelvis 5.0 b31f · axial · 0.57mm/px · z∈[+830,+1210]mm · 13 of 84 slices shown, 15 images]
[im 4/84  soft-tissue]
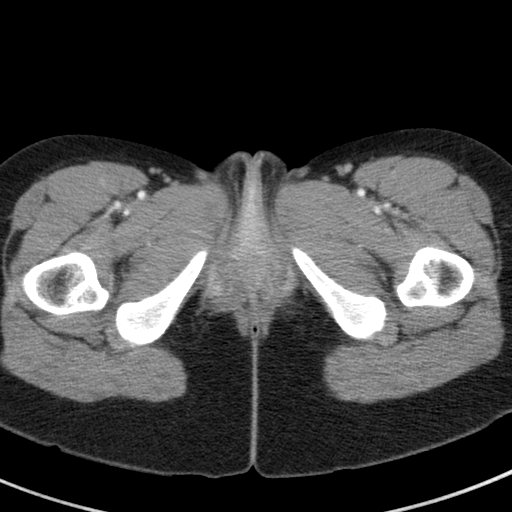
[im 4/84  bone]
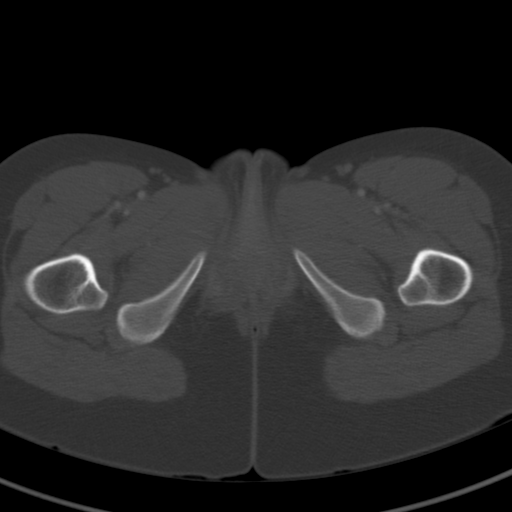
[im 10/84  soft-tissue]
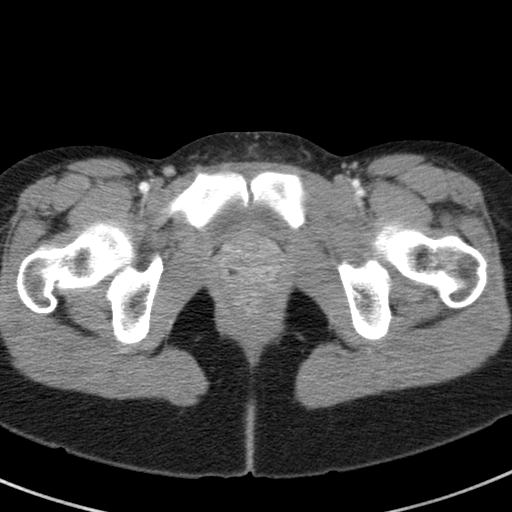
[im 17/84  soft-tissue]
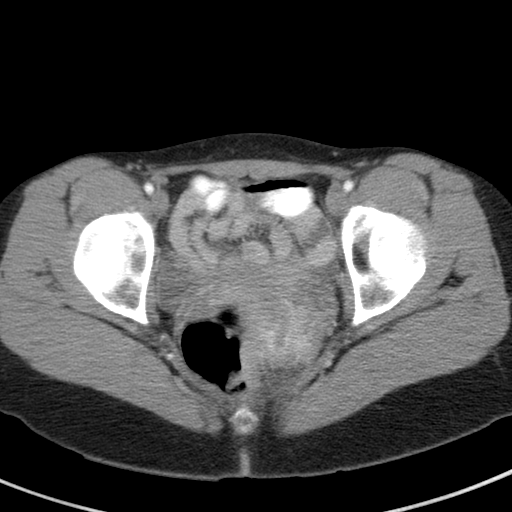
[im 24/84  soft-tissue]
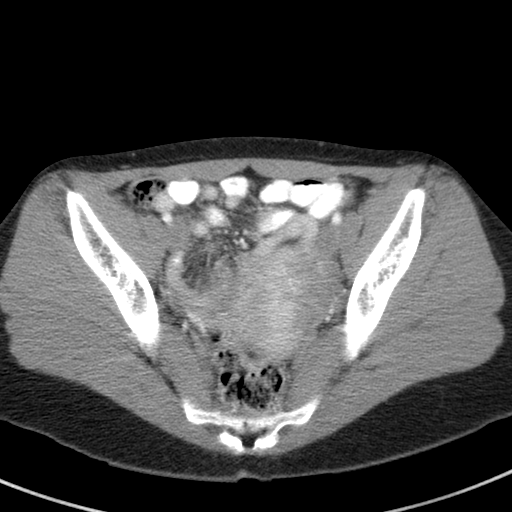
[im 30/84  soft-tissue]
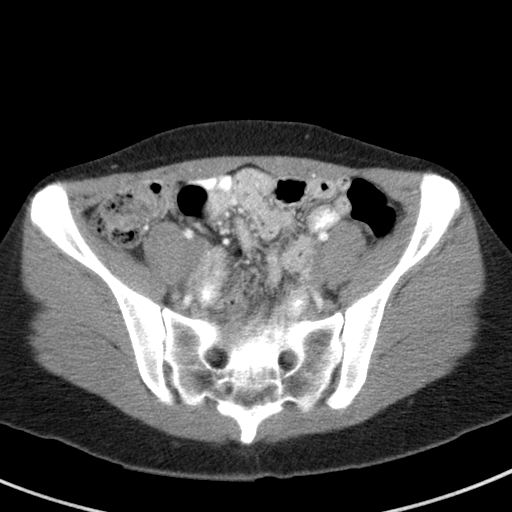
[im 37/84  soft-tissue]
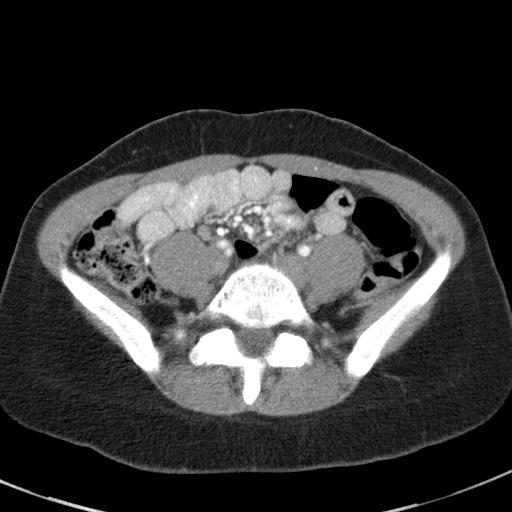
[im 44/84  soft-tissue]
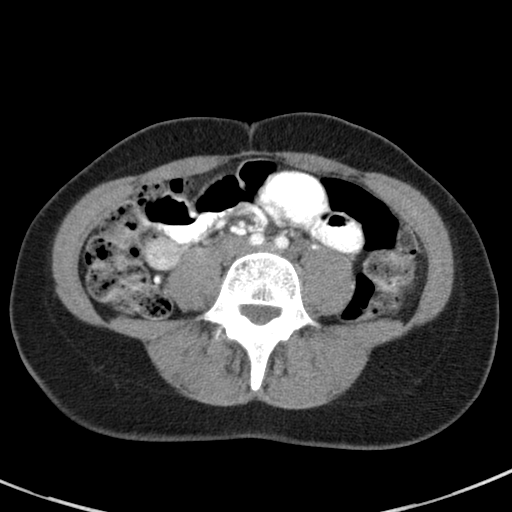
[im 47/84  soft-tissue]
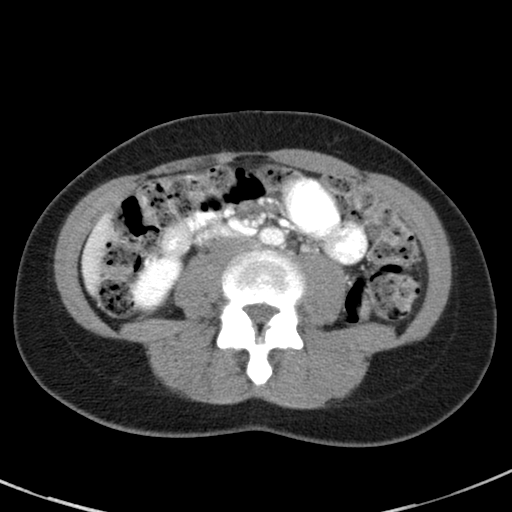
[im 54/84  soft-tissue]
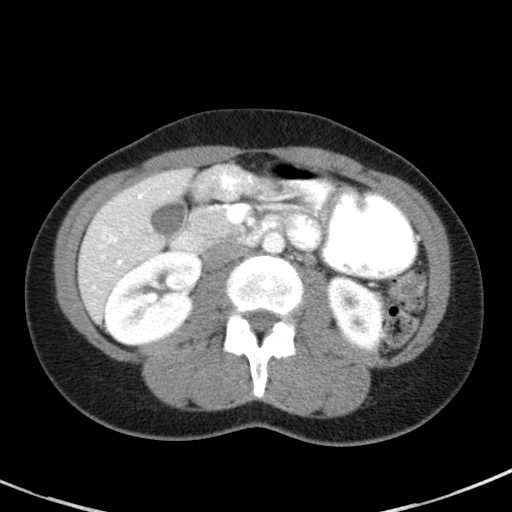
[im 54/84  bone]
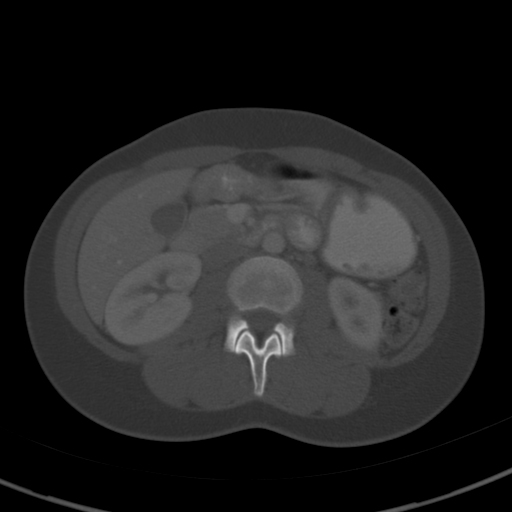
[im 60/84  soft-tissue]
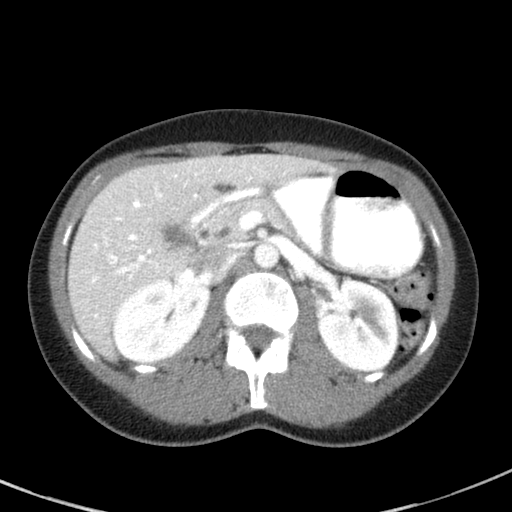
[im 67/84  soft-tissue]
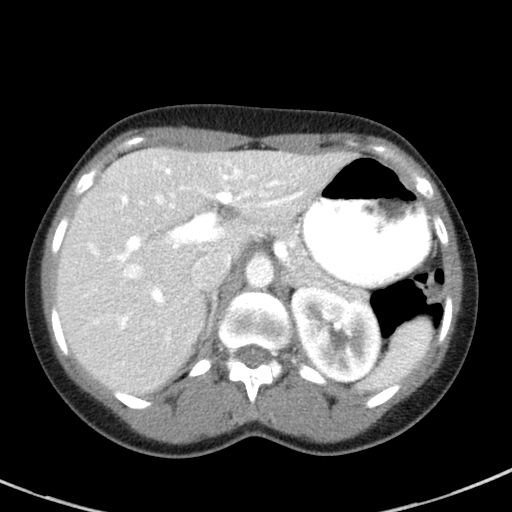
[im 74/84  soft-tissue]
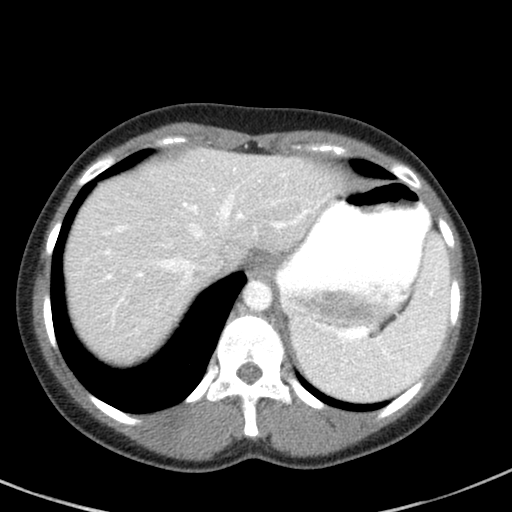
[im 80/84  soft-tissue]
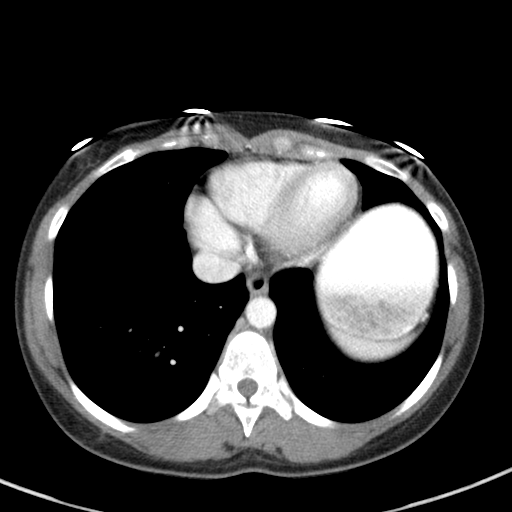

[Series 5: abd/pelvis 3.0 coronal · coronal · 0.62mm/px · 3 of 70 slices shown]
[im 24/70  soft-tissue]
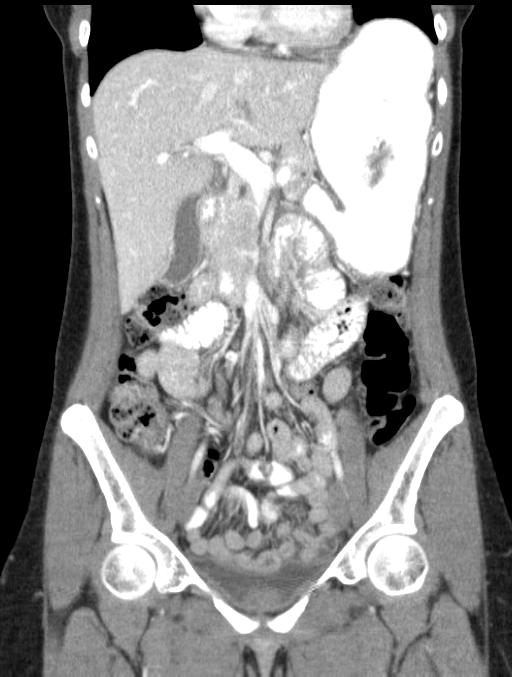
[im 31/70  soft-tissue]
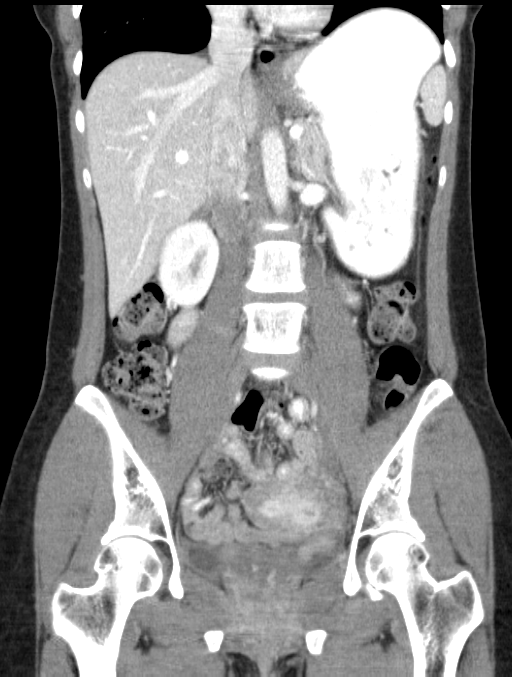
[im 39/70  soft-tissue]
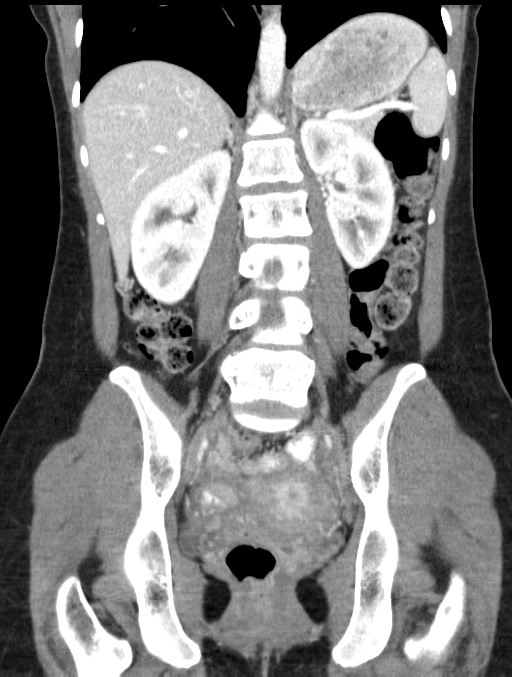

[16 of 46 positions shown; findings below may reference images not displayed]

FINDINGS: Stable and negative lung bases.  No pericardial or pleural effusion.

No osseous abnormality identified.

No pelvic free fluid. Prominent parametrial soft tissues similar to
the prior study. Diminutive bladder. Mildly redundant but otherwise
negative distal colon. No inguinal or pelvic lymphadenopathy
identified.

Negative left colon. Retained stool in the transverse colon and at
both flexures. Negative right colon. Normal appendix seen on series
2, image 56. No dilated small bowel. Decompressed distal small bowel
in the pelvis. Oral contrast has not reached the terminal ileum.
Stomach is mildly distended with contrast, otherwise within normal
limits. Negative duodenum.

Liver, gallbladder, spleen, pancreas, and adrenal glands are within
normal limits. No abdominal free fluid. Portal venous system and
major arterial structures in the abdomen and pelvis are patent. No
abdominal lymphadenopathy identified.

Smaller right renal collecting system. Right lower pole simple
appearing renal cyst appears stable. Negative kidneys otherwise.
IMPRESSION: 1. Normal appendix. No focal inflammation identified in the abdomen.
2. Prominent and somewhat indistinct parametrial soft tissues.
Consider pelvic inflammatory disease in this setting.

## 2015-11-15 ENCOUNTER — Ambulatory Visit (HOSPITAL_COMMUNITY): Payer: Medicaid Other

## 2015-11-18 ENCOUNTER — Other Ambulatory Visit (HOSPITAL_COMMUNITY): Payer: Self-pay | Admitting: Maternal and Fetal Medicine

## 2015-11-18 ENCOUNTER — Ambulatory Visit (HOSPITAL_COMMUNITY)
Admission: RE | Admit: 2015-11-18 | Discharge: 2015-11-18 | Disposition: A | Discharge status: Home / Self Care | Payer: Medicaid Other | Source: Ambulatory Visit | Attending: Maternal and Fetal Medicine | Admitting: Maternal and Fetal Medicine

## 2015-11-18 ENCOUNTER — Encounter (HOSPITAL_COMMUNITY): Payer: Self-pay

## 2015-11-18 VITALS — BP 111/65 | HR 85 | Wt 149.1 lb

## 2015-11-18 DIAGNOSIS — O281 Abnormal biochemical finding on antenatal screening of mother: Secondary | ICD-10-CM | POA: Diagnosis not present

## 2015-11-18 DIAGNOSIS — Z3A25 25 weeks gestation of pregnancy: Secondary | ICD-10-CM | POA: Diagnosis not present

## 2015-11-18 DIAGNOSIS — O289 Unspecified abnormal findings on antenatal screening of mother: Secondary | ICD-10-CM

## 2015-11-18 DIAGNOSIS — Z3A29 29 weeks gestation of pregnancy: Secondary | ICD-10-CM

## 2015-11-18 DIAGNOSIS — O283 Abnormal ultrasonic finding on antenatal screening of mother: Secondary | ICD-10-CM | POA: Insufficient documentation

## 2015-12-16 ENCOUNTER — Ambulatory Visit (HOSPITAL_COMMUNITY): Payer: Medicaid Other

## 2015-12-20 ENCOUNTER — Ambulatory Visit (HOSPITAL_COMMUNITY)
Admission: RE | Admit: 2015-12-20 | Discharge: 2015-12-20 | Disposition: A | Discharge status: Home / Self Care | Payer: Medicaid Other | Source: Ambulatory Visit | Attending: Specialist | Admitting: Specialist

## 2015-12-20 ENCOUNTER — Encounter (HOSPITAL_COMMUNITY): Payer: Self-pay

## 2015-12-20 VITALS — BP 126/76 | HR 73 | Wt 157.6 lb

## 2015-12-20 DIAGNOSIS — Z3A29 29 weeks gestation of pregnancy: Secondary | ICD-10-CM

## 2015-12-20 DIAGNOSIS — O281 Abnormal biochemical finding on antenatal screening of mother: Secondary | ICD-10-CM | POA: Diagnosis present

## 2015-12-20 DIAGNOSIS — O365990 Maternal care for other known or suspected poor fetal growth, unspecified trimester, not applicable or unspecified: Secondary | ICD-10-CM

## 2015-12-27 ENCOUNTER — Encounter (HOSPITAL_COMMUNITY): Payer: Self-pay

## 2015-12-27 ENCOUNTER — Other Ambulatory Visit (HOSPITAL_COMMUNITY): Payer: Self-pay | Admitting: Maternal and Fetal Medicine

## 2015-12-27 ENCOUNTER — Ambulatory Visit (HOSPITAL_COMMUNITY)
Admission: RE | Admit: 2015-12-27 | Discharge: 2015-12-27 | Disposition: A | Discharge status: Home / Self Care | Payer: Medicaid Other | Source: Ambulatory Visit | Attending: Specialist | Admitting: Specialist

## 2015-12-27 DIAGNOSIS — O365990 Maternal care for other known or suspected poor fetal growth, unspecified trimester, not applicable or unspecified: Secondary | ICD-10-CM

## 2015-12-27 DIAGNOSIS — Z3A30 30 weeks gestation of pregnancy: Secondary | ICD-10-CM | POA: Diagnosis not present

## 2015-12-27 DIAGNOSIS — O281 Abnormal biochemical finding on antenatal screening of mother: Secondary | ICD-10-CM | POA: Diagnosis not present

## 2016-01-03 ENCOUNTER — Ambulatory Visit (HOSPITAL_COMMUNITY)
Admission: RE | Admit: 2016-01-03 | Discharge: 2016-01-03 | Disposition: A | Discharge status: Home / Self Care | Payer: Medicaid Other | Source: Ambulatory Visit | Attending: Specialist | Admitting: Specialist

## 2016-01-03 ENCOUNTER — Other Ambulatory Visit (HOSPITAL_COMMUNITY): Payer: Self-pay | Admitting: Maternal and Fetal Medicine

## 2016-01-03 DIAGNOSIS — O281 Abnormal biochemical finding on antenatal screening of mother: Secondary | ICD-10-CM

## 2016-01-03 DIAGNOSIS — O365990 Maternal care for other known or suspected poor fetal growth, unspecified trimester, not applicable or unspecified: Secondary | ICD-10-CM

## 2016-01-03 DIAGNOSIS — Z3A31 31 weeks gestation of pregnancy: Secondary | ICD-10-CM

## 2016-01-03 DIAGNOSIS — O365930 Maternal care for other known or suspected poor fetal growth, third trimester, not applicable or unspecified: Secondary | ICD-10-CM | POA: Insufficient documentation

## 2016-01-03 DIAGNOSIS — O280 Abnormal hematological finding on antenatal screening of mother: Secondary | ICD-10-CM

## 2016-01-10 ENCOUNTER — Other Ambulatory Visit (HOSPITAL_COMMUNITY): Payer: Self-pay | Admitting: Maternal and Fetal Medicine

## 2016-01-10 ENCOUNTER — Ambulatory Visit (HOSPITAL_COMMUNITY)
Admission: RE | Admit: 2016-01-10 | Discharge: 2016-01-10 | Disposition: A | Discharge status: Home / Self Care | Payer: Medicaid Other | Source: Ambulatory Visit | Attending: Specialist | Admitting: Specialist

## 2016-01-10 ENCOUNTER — Encounter (HOSPITAL_COMMUNITY): Payer: Self-pay

## 2016-01-10 DIAGNOSIS — O281 Abnormal biochemical finding on antenatal screening of mother: Secondary | ICD-10-CM | POA: Diagnosis present

## 2016-01-10 DIAGNOSIS — O365930 Maternal care for other known or suspected poor fetal growth, third trimester, not applicable or unspecified: Secondary | ICD-10-CM | POA: Insufficient documentation

## 2016-01-10 DIAGNOSIS — Z3A32 32 weeks gestation of pregnancy: Secondary | ICD-10-CM

## 2016-01-10 DIAGNOSIS — O365990 Maternal care for other known or suspected poor fetal growth, unspecified trimester, not applicable or unspecified: Secondary | ICD-10-CM

## 2016-01-18 ENCOUNTER — Ambulatory Visit (HOSPITAL_COMMUNITY): Admission: RE | Admit: 2016-01-18 | Payer: Medicaid Other | Source: Ambulatory Visit

## 2016-01-25 ENCOUNTER — Ambulatory Visit (HOSPITAL_COMMUNITY): Admission: RE | Admit: 2016-01-25 | Payer: Medicaid Other | Source: Ambulatory Visit

## 2016-01-31 ENCOUNTER — Ambulatory Visit (HOSPITAL_COMMUNITY): Payer: Medicaid Other

## 2016-02-08 ENCOUNTER — Ambulatory Visit (HOSPITAL_COMMUNITY): Payer: Medicaid Other

## 2016-02-14 ENCOUNTER — Ambulatory Visit (HOSPITAL_COMMUNITY): Payer: Medicaid Other

## 2016-02-21 ENCOUNTER — Ambulatory Visit (HOSPITAL_COMMUNITY): Payer: Medicaid Other

## 2016-02-28 ENCOUNTER — Other Ambulatory Visit (HOSPITAL_COMMUNITY): Payer: Medicaid Other

## 2016-04-26 ENCOUNTER — Emergency Department (HOSPITAL_BASED_OUTPATIENT_CLINIC_OR_DEPARTMENT_OTHER)
Admission: EM | Admit: 2016-04-26 | Discharge: 2016-04-26 | Disposition: A | Discharge status: Home / Self Care | Payer: Medicaid Other | Attending: Emergency Medicine | Admitting: Emergency Medicine

## 2016-04-26 ENCOUNTER — Encounter (HOSPITAL_BASED_OUTPATIENT_CLINIC_OR_DEPARTMENT_OTHER): Payer: Self-pay | Admitting: Emergency Medicine

## 2016-04-26 DIAGNOSIS — K0889 Other specified disorders of teeth and supporting structures: Secondary | ICD-10-CM | POA: Insufficient documentation

## 2016-04-26 MED ORDER — PENICILLIN V POTASSIUM 500 MG PO TABS: 500.0000 mg | ORAL_TABLET | Freq: Four times a day (QID) | ORAL | 0 refills | Status: DC

## 2016-04-26 MED ORDER — NAPROXEN 500 MG PO TABS: 500.0000 mg | ORAL_TABLET | Freq: Two times a day (BID) | ORAL | 0 refills | Status: DC

## 2016-04-26 NOTE — ED Provider Notes (Signed)
[facility]   CSN: [id number] Arrival date & time: 04/26/16  1529  By signing my name below, I, Freida Busmaniana Omoyeni, attest that this documentation has been prepared under the direction and in the presence of non-physician practitioner, Everlene FarrierWilliam Anselma Herbel, PA-C. Electronically Signed: Freida Busmaniana Omoyeni, Scribe. 04/26/2016. 4:33 PM.   History   Chief Complaint Chief Complaint  Patient presents with  . Dental Pain    The history is provided by the patient. No language interpreter was used.     HPI Comments:  Janice Graham is a 30 y.o. female who presents to the Emergency Department complaining of constant, moderate, right lower dental pain x 4 days. Pt states her tooth cracked while eating, she has been having pain since. She has been taking ibuprofen without relief. She denies drainage from the site of pain, neck pain, ear pain, fever, double vision, and difficulty swallowing. Pt has a dentist but has been unable to see them; she missed her appointment this week but rescheduled for next week.   Past Medical History:  Diagnosis Date  . BV (bacterial vaginosis)   . Goiter   . UTI (lower urinary tract infection)     Patient Active Problem List   Diagnosis Date Noted  . [redacted] weeks gestation of pregnancy   . Abnormal findings on antenatal screening     Past Surgical History:  Procedure Laterality Date  . TONSILLECTOMY    . WISDOM TOOTH EXTRACTION      OB History    Gravida Para Term Preterm AB Living   3 2 2     2    SAB TAB Ectopic Multiple Live Births                   Home Medications    Prior to Admission medications   Medication Sig Start Date End Date Taking? Authorizing Provider  cephALEXin (KEFLEX) 500 MG capsule Take 1 capsule (500 mg total) by mouth 2 (two) times daily. Patient not taking: Reported on 11/18/2015 07/30/15   Tilden FossaElizabeth Rees, MD  cetirizine (ZYRTEC ALLERGY) 10 MG tablet Take 1 tablet (10 mg total) by mouth daily. Patient not taking:  Reported on 11/18/2015 09/17/15   Everlene FarrierWilliam Elleen Coulibaly, PA-C  metoCLOPramide (REGLAN) 10 MG tablet Take 1 tablet (10 mg total) by mouth every 6 (six) hours. Patient not taking: Reported on 11/18/2015 07/30/15   Tilden FossaElizabeth Rees, MD  naproxen (NAPROSYN) 500 MG tablet Take 1 tablet (500 mg total) by mouth 2 (two) times daily with a meal. 04/26/16   Everlene FarrierWilliam Juniper Cobey, PA-C  penicillin v potassium (VEETID) 500 MG tablet Take 1 tablet (500 mg total) by mouth 4 (four) times daily. 04/26/16   Everlene FarrierWilliam Izack Hoogland, PA-C  Prenatal Vit-Fe Fumarate-FA (PRENATAL PO) Take by mouth. Reported on 01/10/2016    Historical Provider, MD    Family History No family history on file.  Social History Social History  Substance Use Topics  . Smoking status: Never Smoker  . Smokeless tobacco: Never Used  . Alcohol use No     Allergies   Review of patient's allergies indicates no known allergies.   Review of Systems Review of Systems  Constitutional: Negative for chills and fever.  HENT: Positive for dental problem. Negative for drooling, ear pain, facial swelling, mouth sores, sore throat and trouble swallowing.   Eyes: Negative for pain and visual disturbance.  Musculoskeletal: Negative for neck pain and neck stiffness.  Skin: Negative for rash.  Neurological: Negative for headaches.   Physical Exam  Updated Vital Signs BP 106/81 (BP Location: Left Arm)   Pulse 76   Temp 98 F (36.7 C) (Oral)   Resp 18   Ht 5\' 2"  (1.575 m)   Wt 138 lb (62.6 kg)   LMP 05/27/2015   SpO2 100%   Breastfeeding? Unknown   BMI 25.24 kg/m   Physical Exam  Constitutional: She appears well-developed and well-nourished. No distress.  Non-toxic appearing.   HENT:  Head: Normocephalic and atraumatic.  Right Ear: Tympanic membrane and external ear normal.  Left Ear: Tympanic membrane and external ear normal.  Mouth/Throat: Oropharynx is clear and moist. No trismus in the jaw. No oropharyngeal exudate.  Cracked right lower molar  No obvious  abscess No discharge from the mouth. No facial swelling.  Uvula is midline without edema. Soft palate rises symmetrically. No tonsillar hypertrophy or exudates. Tongue protrusion is normal.  No trismus.  Bilateral tympanic membranes are pearly-gray without erythema or loss of landmarks.   Eyes: Conjunctivae are normal. Pupils are equal, round, and reactive to light. Right eye exhibits no discharge. Left eye exhibits no discharge.  Neck: Normal range of motion. Neck supple. No JVD present. No tracheal deviation present.  Cardiovascular: Normal rate, regular rhythm, normal heart sounds and intact distal pulses.   Pulmonary/Chest: Effort normal and breath sounds normal. No stridor. No respiratory distress.  Lymphadenopathy:    She has no cervical adenopathy.  Neurological: Coordination normal.  Skin: Skin is warm and dry. Capillary refill takes less than 2 seconds. No rash noted. She is not diaphoretic. No erythema. No pallor.  Psychiatric: She has a normal mood and affect. Her behavior is normal.  Nursing note and vitals reviewed.    ED Treatments / Results  DIAGNOSTIC STUDIES:  Oxygen Saturation is 100% on RA, normal by my interpretation.    COORDINATION OF CARE:  4:29 PM Discussed treatment plan with pt at bedside and pt agreed to plan.  Labs (all labs ordered are listed, but only abnormal results are displayed) Labs Reviewed - No data to display  EKG  EKG Interpretation None       Radiology No results found.  Procedures Procedures (including critical care time)  Medications Ordered in ED Medications - No data to display   Initial Impression / Assessment and Plan / ED Course  I have reviewed the triage vital signs and the nursing notes.  Pertinent labs & imaging results that were available during my care of the patient were reviewed by me and considered in my medical decision making (see chart for details).  Clinical Course     Patient with right lower molar  dentalgia.  No abscess requiring immediate incision and drainage.  Exam not concerning for Ludwig's angina or pharyngeal abscess.  Will treat with PCN and discharged with Naprosyn. Pt instructed to follow-up with dentist.  Discussed return precautions. Pt safe for discharge. I advised the patient to follow-up with their primary care provider this week. I advised the patient to return to the emergency department with new or worsening symptoms or new concerns. The patient verbalized understanding and agreement with plan.    Final Clinical Impressions(s) / ED Diagnoses   Final diagnoses:  Pain, dental    New Prescriptions New Prescriptions   NAPROXEN (NAPROSYN) 500 MG TABLET    Take 1 tablet (500 mg total) by mouth 2 (two) times daily with a meal.   PENICILLIN V POTASSIUM (VEETID) 500 MG TABLET    Take 1 tablet (500 mg total) by mouth  4 (four) times daily.   I personally performed the services described in this documentation, which was scribed in my presence. The recorded information has been reviewed and is accurate.       Everlene Farrier, PA-C 04/26/16 1644    Benjiman Core, MD 04/26/16 2322

## 2016-04-26 NOTE — ED Triage Notes (Signed)
Patient states that her tooth cracked and she is now having pain

## 2016-05-24 ENCOUNTER — Emergency Department (HOSPITAL_BASED_OUTPATIENT_CLINIC_OR_DEPARTMENT_OTHER): Payer: Medicaid Other

## 2016-05-24 ENCOUNTER — Encounter (HOSPITAL_BASED_OUTPATIENT_CLINIC_OR_DEPARTMENT_OTHER): Payer: Self-pay

## 2016-05-24 DIAGNOSIS — W230XXA Caught, crushed, jammed, or pinched between moving objects, initial encounter: Secondary | ICD-10-CM | POA: Insufficient documentation

## 2016-05-24 DIAGNOSIS — Y939 Activity, unspecified: Secondary | ICD-10-CM | POA: Insufficient documentation

## 2016-05-24 DIAGNOSIS — S6792XA Crushing injury of unspecified part(s) of left wrist, hand and fingers, initial encounter: Secondary | ICD-10-CM | POA: Insufficient documentation

## 2016-05-24 DIAGNOSIS — Y929 Unspecified place or not applicable: Secondary | ICD-10-CM | POA: Insufficient documentation

## 2016-05-24 DIAGNOSIS — Y999 Unspecified external cause status: Secondary | ICD-10-CM | POA: Insufficient documentation

## 2016-05-24 NOTE — ED Triage Notes (Signed)
Pt smashed left index finger in car door approximately 30 minutes prior to arrival

## 2016-05-25 ENCOUNTER — Emergency Department (HOSPITAL_BASED_OUTPATIENT_CLINIC_OR_DEPARTMENT_OTHER)
Admission: EM | Admit: 2016-05-25 | Discharge: 2016-05-25 | Disposition: A | Discharge status: Home / Self Care | Payer: Medicaid Other | Attending: Emergency Medicine | Admitting: Emergency Medicine

## 2016-05-25 DIAGNOSIS — S6792XA Crushing injury of unspecified part(s) of left wrist, hand and fingers, initial encounter: Secondary | ICD-10-CM

## 2016-05-25 MED ORDER — HYDROCODONE-ACETAMINOPHEN 5-325 MG PO TABS
1.0000 | ORAL_TABLET | Freq: Four times a day (QID) | ORAL | 0 refills | Status: DC | PRN
Start: 2016-05-25 — End: 2016-08-17

## 2016-05-25 MED ORDER — HYDROCODONE-ACETAMINOPHEN 5-325 MG PO TABS
1.0000 | ORAL_TABLET | Freq: Once | ORAL | Status: AC
  Administered 2016-05-25: 1 via ORAL
  Filled 2016-05-25: qty 1

## 2016-05-25 NOTE — ED Provider Notes (Signed)
[facility] Provider Note: Janice Dell, MD, FACEP  CSN: [id number] MRN: [id number] ARRIVAL: 05/24/16 at 2312   CHIEF COMPLAINT  Finger Injury   HISTORY OF PRESENT ILLNESS  Janice Graham is a 30 y.o. female slammed her left hand in her car door about 30 minutes prior to arrival. She is having moderate to severe pain in her proximal left index finger and left second metacarpal. Pain is worse with palpation or movement and range of motion is limited by pain. There is some mild associated swelling. There is no deformity. She denies wrist pain.   Past Medical History:  Diagnosis Date  . BV (bacterial vaginosis)   . Goiter   . UTI (lower urinary tract infection)     Past Surgical History:  Procedure Laterality Date  . TONSILLECTOMY    . WISDOM TOOTH EXTRACTION      No family history on file.  Social History  Substance Use Topics  . Smoking status: Never Smoker  . Smokeless tobacco: Never Used  . Alcohol use No    Prior to Admission medications   Medication Sig Start Date End Date Taking? Authorizing Provider  naproxen (NAPROSYN) 500 MG tablet Take 1 tablet (500 mg total) by mouth 2 (two) times daily with a meal. 04/26/16   Everlene Farrier, PA-C  penicillin v potassium (VEETID) 500 MG tablet Take 1 tablet (500 mg total) by mouth 4 (four) times daily. 04/26/16   Everlene Farrier, PA-C  Prenatal Vit-Fe Fumarate-FA (PRENATAL PO) Take by mouth. Reported on 01/10/2016    Historical Provider, MD    Allergies Review of patient's allergies indicates no known allergies.   REVIEW OF SYSTEMS  Negative except as noted here or in the History of Present Illness.   PHYSICAL EXAMINATION  Initial Vital Signs Blood pressure 123/75, pulse 89, temperature 98.1 F (36.7 C), temperature source Oral, resp. rate 18, height 5\' 2"  (1.575 m), weight 139 lb (63 kg), last menstrual period 05/19/2016, SpO2 100 %, unknown if currently breastfeeding.  Examination General: Well-developed,  well-nourished female in no acute distress; appearance consistent with age of record HENT: normocephalic; atraumatic Eyes: Normal appearance Neck: supple Heart: regular rate and rhythm Lungs: clear to auscultation bilaterally Abdomen: soft; nondistended; nontender Extremities: No deformity; tenderness of proximal phalanx of left index finger and associated metacarpal with mild swelling and decreased range of motion of that finger; no wrist or snuffbox tenderness Neurologic: Awake, alert and oriented; motor function intact in all extremities and symmetric; no facial droop Skin: Warm and dry Psychiatric: Normal mood and affect   RESULTS  Summary of this visit's results, reviewed by myself:   EKG Interpretation  Date/Time:    Ventricular Rate:    PR Interval:    QRS Duration:   QT Interval:    QTC Calculation:   R Axis:     Text Interpretation:        Laboratory Studies: No results found for this or any previous visit (from the past 24 hour(s)). Imaging Studies: Dg Finger Index Left  Result Date: 05/25/2016 CLINICAL DATA:  30 year old female with trauma to the index finger of the left hand. EXAM: LEFT INDEX FINGER 2+V COMPARISON:  None. FINDINGS: There is no evidence of fracture or dislocation. There is no evidence of arthropathy or other focal bone abnormality. Soft tissues are unremarkable. IMPRESSION: Negative. Electronically Signed   By: Elgie Collard M.D.   On: 05/25/2016 00:14    ED COURSE  Nursing notes and initial vitals signs, including  pulse oximetry, reviewed.  Vitals:   05/24/16 2321 05/24/16 2322  BP: 123/75   Pulse: 89   Resp: 18   Temp: 98.1 F (36.7 C)   TempSrc: Oral   SpO2: 100%   Weight:  139 lb (63 kg)  Height:  5\' 2"  (1.575 m)    PROCEDURES    ED DIAGNOSES     ICD-9-CM ICD-10-CM   1. Crushing injury of finger with hand or wrist, left, initial encounter 927.8 S67.92XA        Janice LibraJohn Emonte Dieujuste, MD 05/25/16 [phone]

## 2016-08-17 ENCOUNTER — Emergency Department (HOSPITAL_BASED_OUTPATIENT_CLINIC_OR_DEPARTMENT_OTHER): Payer: Medicaid Other

## 2016-08-17 ENCOUNTER — Encounter (HOSPITAL_BASED_OUTPATIENT_CLINIC_OR_DEPARTMENT_OTHER): Payer: Self-pay | Admitting: Emergency Medicine

## 2016-08-17 ENCOUNTER — Emergency Department (HOSPITAL_BASED_OUTPATIENT_CLINIC_OR_DEPARTMENT_OTHER)
Admission: EM | Admit: 2016-08-17 | Discharge: 2016-08-17 | Disposition: A | Discharge status: Home / Self Care | Payer: Medicaid Other | Attending: Emergency Medicine | Admitting: Emergency Medicine

## 2016-08-17 DIAGNOSIS — R0789 Other chest pain: Secondary | ICD-10-CM | POA: Diagnosis present

## 2016-08-17 DIAGNOSIS — N3001 Acute cystitis with hematuria: Secondary | ICD-10-CM | POA: Insufficient documentation

## 2016-08-17 LAB — BASIC METABOLIC PANEL
ANION GAP: 8 (ref 5–15)
BUN: 9 mg/dL (ref 6–20)
CALCIUM: 8.8 mg/dL — AB (ref 8.9–10.3)
CO2: 26 mmol/L (ref 22–32)
Chloride: 103 mmol/L (ref 101–111)
Creatinine, Ser: 0.67 mg/dL (ref 0.44–1.00)
GLUCOSE: 90 mg/dL (ref 65–99)
Potassium: 3.1 mmol/L — ABNORMAL LOW (ref 3.5–5.1)
SODIUM: 137 mmol/L (ref 135–145)

## 2016-08-17 LAB — URINALYSIS, ROUTINE W REFLEX MICROSCOPIC
Bilirubin Urine: NEGATIVE
Glucose, UA: NEGATIVE mg/dL
KETONES UR: NEGATIVE mg/dL
NITRITE: NEGATIVE
PROTEIN: NEGATIVE mg/dL
Specific Gravity, Urine: 1.016 (ref 1.005–1.030)
pH: 6 (ref 5.0–8.0)

## 2016-08-17 LAB — CBC WITH DIFFERENTIAL/PLATELET
BASOS ABS: 0 10*3/uL (ref 0.0–0.1)
BASOS PCT: 0 %
Eosinophils Absolute: 0 10*3/uL (ref 0.0–0.7)
Eosinophils Relative: 1 %
HEMATOCRIT: 30.5 % — AB (ref 36.0–46.0)
Hemoglobin: 10.2 g/dL — ABNORMAL LOW (ref 12.0–15.0)
Lymphocytes Relative: 48 %
Lymphs Abs: 2.3 10*3/uL (ref 0.7–4.0)
MCH: 25.8 pg — ABNORMAL LOW (ref 26.0–34.0)
MCHC: 33.4 g/dL (ref 30.0–36.0)
MCV: 77.2 fL — ABNORMAL LOW (ref 78.0–100.0)
MONO ABS: 0.5 10*3/uL (ref 0.1–1.0)
Monocytes Relative: 10 %
NEUTROS ABS: 2 10*3/uL (ref 1.7–7.7)
Neutrophils Relative %: 41 %
PLATELETS: 286 10*3/uL (ref 150–400)
RBC: 3.95 MIL/uL (ref 3.87–5.11)
RDW: 15.6 % — AB (ref 11.5–15.5)
WBC: 4.9 10*3/uL (ref 4.0–10.5)

## 2016-08-17 LAB — URINALYSIS, MICROSCOPIC (REFLEX)

## 2016-08-17 LAB — TROPONIN I: Troponin I: <0.03 ng/mL (ref <0.03)

## 2016-08-17 LAB — D-DIMER, QUANTITATIVE (NOT AT ARMC): D DIMER QUANT: 0.46 ug{FEU}/mL (ref 0.00–0.50)

## 2016-08-17 LAB — PREGNANCY, URINE: PREG TEST UR: NEGATIVE

## 2016-08-17 MED ORDER — POTASSIUM CHLORIDE CRYS ER 20 MEQ PO TBCR
40.0000 meq | EXTENDED_RELEASE_TABLET | Freq: Once | ORAL | Status: AC
  Administered 2016-08-17: 40 meq via ORAL
  Filled 2016-08-17: qty 2

## 2016-08-17 MED ORDER — CIPROFLOXACIN HCL 500 MG PO TABS: 500.0000 mg | ORAL_TABLET | Freq: Two times a day (BID) | ORAL | 0 refills | Status: DC

## 2016-08-17 MED ORDER — HYDROCODONE-ACETAMINOPHEN 5-325 MG PO TABS
1.0000 | ORAL_TABLET | Freq: Once | ORAL | Status: AC
  Administered 2016-08-17: 1 via ORAL
  Filled 2016-08-17: qty 1

## 2016-08-17 MED FILL — CIPROFLOXACIN HCL 500 MG TA: 500 | 7 days supply | Qty: 14 | Fill #0

## 2016-08-17 NOTE — ED Triage Notes (Signed)
Pt states she believes she has a bladder infection.  She is also complaining of left rib and back pain.  All for last 2-3 days.  Pt states her fiance noticed that her back was swollen on that side. No known injury.  No fever.  Harder to breathe when lying down.  No recent long trips.

## 2016-08-17 NOTE — ED Provider Notes (Signed)
[facility] Provider Note   CSN: [id number] Arrival date & time: 08/17/16 0818     History    Chief Complaint  Patient presents with  . Dysuria  . Back Pain     HPI Janice Graham is a 31 y.o. female.  31yo F w/ PMH including UTI who p/w dysuria and L chest wall pain. Patient endorses a 2 to three-day history of dysuria, suprapubic pain, and low back pain. The symptoms are similar to previous symptoms of UTI. She denies any blood in her urine or vaginal bleeding/discharge. No fevers or vomiting, she has had mild nausea. Patient also notes, unrelated, that she has had a few days of atraumatic left-sided rib and left upper back pain. She states that it has been hard to breathe. She has taken 800mg  Ibuprofen and tylenol at 5am w/ no relief. She denies any anterior chest pain. Her symptoms are worse when she lays flat. She denies any cough/cold symptoms. No recent travel, OCP use, history of cancer, or history of blood clots.   Past Medical History:  Diagnosis Date  . BV (bacterial vaginosis)   . Goiter   . UTI (lower urinary tract infection)      Patient Active Problem List   Diagnosis Date Noted  . [redacted] weeks gestation of pregnancy   . Abnormal findings on antenatal screening     Past Surgical History:  Procedure Laterality Date  . TONSILLECTOMY    . WISDOM TOOTH EXTRACTION      OB History    Gravida Para Term Preterm AB Living   3 2 2     2    SAB TAB Ectopic Multiple Live Births                    Home Medications    Prior to Admission medications   Medication Sig Start Date End Date Taking? Authorizing Provider  ciprofloxacin (CIPRO) 500 MG tablet Take 1 tablet (500 mg total) by mouth 2 (two) times daily. 08/17/16   Laurence Spates, MD      No family history on file.   Social History  Substance Use Topics  . Smoking status: Never Smoker  . Smokeless tobacco: Never Used  . Alcohol use No     Allergies     Patient has no known  allergies.    Review of Systems  10 Systems reviewed and are negative for acute change except as noted in the HPI.   Physical Exam Updated Vital Signs BP 115/75 (BP Location: Right Arm)   Pulse 88   Temp 98.1 F (36.7 C) (Oral)   Resp 18   Wt 139 lb (63 kg)   LMP 08/07/2016   SpO2 100%   BMI 25.42 kg/m   Physical Exam  Constitutional: She is oriented to person, place, and time. She appears well-developed and well-nourished. No distress.  HENT:  Head: Normocephalic and atraumatic.  Moist mucous membranes  Eyes: Conjunctivae are normal.  Neck: Neck supple.  Cardiovascular: Normal rate, regular rhythm and normal heart sounds.   No murmur heard. Pulmonary/Chest: Effort normal and breath sounds normal.  Abdominal: Soft. Bowel sounds are normal. She exhibits no distension. There is no tenderness.  Musculoskeletal: She exhibits no edema.  Tenderness of L posterior ribs along bra line w/ no crepitus or skin changes  Neurological: She is alert and oriented to person, place, and time.  Fluent speech  Skin: Skin is warm and dry. No rash noted.  Psychiatric: She has  a normal mood and affect. Judgment normal.  Nursing note and vitals reviewed.     ED Treatments / Results  Labs (all labs ordered are listed, but only abnormal results are displayed) Labs Reviewed  URINALYSIS, ROUTINE W REFLEX MICROSCOPIC - Abnormal; Notable for the following:       Result Value   APPearance TURBID (*)    Hgb urine dipstick MODERATE (*)    Leukocytes, UA LARGE (*)    All other components within normal limits  BASIC METABOLIC PANEL - Abnormal; Notable for the following:    Potassium 3.1 (*)    Calcium 8.8 (*)    All other components within normal limits  CBC WITH DIFFERENTIAL/PLATELET - Abnormal; Notable for the following:    Hemoglobin 10.2 (*)    HCT 30.5 (*)    MCV 77.2 (*)    MCH 25.8 (*)    RDW 15.6 (*)    All other components within normal limits  URINALYSIS, MICROSCOPIC (REFLEX)  - Abnormal; Notable for the following:    Bacteria, UA MANY (*)    Squamous Epithelial / LPF 0-5 (*)    Non Squamous Epithelial PRESENT (*)    All other components within normal limits  URINE CULTURE  PREGNANCY, URINE  TROPONIN I  D-DIMER, QUANTITATIVE (NOT AT [facility])     EKG  EKG Interpretation  Date/Time:  Friday August 17 2016 09:11:15 EST Ventricular Rate:  66 PR Interval:    QRS Duration: 71 QT Interval:  394 QTC Calculation: 413 R Axis:   71 Text Interpretation:  Sinus rhythm EKG WITHIN NORMAL LIMITS Confirmed by Adam Sanjuan MD, Tevis Conger [phone]) on 08/17/2016 9:30:10 AM         Radiology Dg Chest 2 View  Result Date: 08/17/2016 CLINICAL DATA:  Sob x2days. Bladder infection. Nonsmoker. EXAM: CHEST - 2 VIEW COMPARISON:  02/15/ 2007 FINDINGS: Lungs are clear. Heart size and mediastinal contours are within normal limits. No effusion. Mild thoracic dextroscoliosis without evident underlying bony anomaly. IMPRESSION: No acute cardiopulmonary disease. Electronically Signed   By: Corlis Leak M.D.   On: 08/17/2016 09:28    Procedures Procedures (including critical care time) Procedures  Medications Ordered in ED  Medications  potassium chloride SA (K-DUR,KLOR-CON) CR tablet 40 mEq (not administered)  HYDROcodone-acetaminophen (NORCO/VICODIN) 5-325 MG per tablet 1 tablet (1 tablet Oral Given 08/17/16 0909)     Initial Impression / Assessment and Plan / ED Course  I have reviewed the triage vital signs and the nursing notes.  Pertinent labs & imaging results that were available during my care of the patient were reviewed by me and considered in my medical decision making (see chart for details).  Clinical Course     PT w/ a few days of dysuria similar to previous UTI, as well as L rib pain and reports that it has been difficult to breathe. She was well-appearing on exam with normal vital signs including O2 100% on room air. She had mild tenderness of left posterior rib cage  without any skin changes or crepitus. No obvious swelling. UA is consistent with infection. She has a normal WBC count and is afebrile with no focal VA tenderness therefore I feel she is appropriate for outpatient management of uncomplicated cystitis. I reviewed her previous culture results which shows some resistance to cefazolin, all cultures sensitive to ciprofloxacin so I discussed this medication and patient is in agreement.  Regarding her reports of left rib pain and problems breathing, she is PERC negative and I  discussed w/ her that likelihood of PE is very low, and given her age and health ACS is also very unlikely. I explained risks and benefits of obtaining d-dimer including risk of radiation from CT; pt voiced understanding and wanted to proceed. EKG unremarkable. D-dimer negative, troponin normal. CXR also normal.  I discussed supportive care including tylenol/motrin and stretching exercises. Regarding UTI, reviewed return precautions including worsening sx, fever, vomiting. Also cautioned on side effects of cipro including tendon rupture and cautioned against rigorous physical activity. Pt voiced understanding and was discharged in satisfactory condition.  Final Clinical Impressions(s) / ED Diagnoses   Final diagnoses:  Left-sided chest wall pain  Acute cystitis with hematuria     New Prescriptions   CIPROFLOXACIN (CIPRO) 500 MG TABLET    Take 1 tablet (500 mg total) by mouth 2 (two) times daily.       Laurence Spatesachel Morgan Taysean Wager, MD 08/17/16 1009

## 2016-08-19 LAB — URINE CULTURE
Culture: >=100000 — AB
SPECIAL REQUESTS: NORMAL

## 2016-08-20 ENCOUNTER — Telehealth: Payer: Self-pay

## 2016-08-20 NOTE — Telephone Encounter (Signed)
Post ED Visit - Positive Culture Follow-up  Culture report reviewed by antimicrobial stewardship pharmacist:  []  Enzo BiNathan Batchelder, Pharm.D. []  Celedonio MiyamotoJeremy Frens, Pharm.D., BCPS []  Garvin FilaMike Maccia, Pharm.D. []  Georgina PillionElizabeth Martin, Pharm.D., BCPS []  RockwoodMinh Pham, [address]Ph[address]D., BCPS, AAHIVP []  Estella HuskMichelle Turner, Pharm.D., BCPS, AAHIVP []  Tennis Mustassie Stewart, Pharm.D. []  Sherle Poeob Vincent, [address]Ph[address] Joe Arminger Pharm D Positive urine culture Treated with Ciprofloxacin, organism sensitive to the same and no further patient follow-up is required at this time.  Jerry CarasCullom, Baylei Siebels Burnett 08/20/2016, 9:31 AM

## 2016-11-13 ENCOUNTER — Encounter (HOSPITAL_BASED_OUTPATIENT_CLINIC_OR_DEPARTMENT_OTHER): Payer: Self-pay | Admitting: Emergency Medicine

## 2016-11-13 DIAGNOSIS — N939 Abnormal uterine and vaginal bleeding, unspecified: Secondary | ICD-10-CM | POA: Diagnosis present

## 2016-11-13 DIAGNOSIS — N760 Acute vaginitis: Secondary | ICD-10-CM | POA: Insufficient documentation

## 2016-11-13 NOTE — ED Triage Notes (Signed)
Patient states that she took a pregnancy test about 1 week ago and it was positive. About 4 days ago she started to have some cramping and bleeding. The patient reports that she wants to get this checked out now because it has a foul odor to it

## 2016-11-14 ENCOUNTER — Emergency Department (HOSPITAL_BASED_OUTPATIENT_CLINIC_OR_DEPARTMENT_OTHER)
Admission: EM | Admit: 2016-11-14 | Discharge: 2016-11-14 | Disposition: A | Discharge status: Home / Self Care | Payer: Medicaid Other | Attending: Emergency Medicine | Admitting: Emergency Medicine

## 2016-11-14 DIAGNOSIS — N760 Acute vaginitis: Secondary | ICD-10-CM

## 2016-11-14 DIAGNOSIS — B9689 Other specified bacterial agents as the cause of diseases classified elsewhere: Secondary | ICD-10-CM

## 2016-11-14 DIAGNOSIS — N939 Abnormal uterine and vaginal bleeding, unspecified: Secondary | ICD-10-CM

## 2016-11-14 LAB — URINALYSIS, ROUTINE W REFLEX MICROSCOPIC
Bilirubin Urine: NEGATIVE
Glucose, UA: NEGATIVE mg/dL
KETONES UR: NEGATIVE mg/dL
LEUKOCYTES UA: NEGATIVE
NITRITE: NEGATIVE
PROTEIN: NEGATIVE mg/dL
Specific Gravity, Urine: 1.016 (ref 1.005–1.030)
pH: 6.5 (ref 5.0–8.0)

## 2016-11-14 LAB — PREGNANCY, URINE: PREG TEST UR: NEGATIVE

## 2016-11-14 LAB — WET PREP, GENITAL
Sperm: NONE SEEN
Trich, Wet Prep: NONE SEEN
Yeast Wet Prep HPF POC: NONE SEEN

## 2016-11-14 LAB — URINALYSIS, MICROSCOPIC (REFLEX)

## 2016-11-14 LAB — GC/CHLAMYDIA PROBE AMP (~~LOC~~) NOT AT ARMC
CHLAMYDIA, DNA PROBE: NEGATIVE
NEISSERIA GONORRHEA: NEGATIVE

## 2016-11-14 MED ORDER — METRONIDAZOLE 0.75 % VA GEL: 1.0000 | Freq: Two times a day (BID) | VAGINAL | 0 refills | Status: DC

## 2016-11-14 NOTE — ED Notes (Signed)
Pt states that she had a full period approximately 2 weeks ago, about a week later had foul smelling bloody and brown discharge and a positive pregnancy test.  Says the only other time she had this was after she had her child.  She denies any pain, denies any dysuria, denies vaginal discomfort.

## 2016-11-14 NOTE — ED Provider Notes (Signed)
[facility] Provider Note: Janice Dell, MD, FACEP  CSN: [id number] MRN: [id number] ARRIVAL: 11/13/16 at 2351 ROOM: [facility]05/[facility]05   CHIEF COMPLAINT  Vaginal Bleeding   HISTORY OF PRESENT ILLNESS  Janice Graham is a 31 y.o. female who had a positive home pregnancy test about a week ago. 4 days ago she began bleeding. Her bleeding was heavier than usual. And there was some alternation between dark and bright red bleeding. The bleeding has slowed down significantly at the present time. She is having some mild associated pelvic cramping. She states she has had an abnormal vaginal odor.   Past Medical History:  Diagnosis Date  . BV (bacterial vaginosis)   . Goiter   . UTI (lower urinary tract infection)     Past Surgical History:  Procedure Laterality Date  . TONSILLECTOMY    . WISDOM TOOTH EXTRACTION      History reviewed. No pertinent family history.  Social History  Substance Use Topics  . Smoking status: Never Smoker  . Smokeless tobacco: Never Used  . Alcohol use No    Prior to Admission medications   Medication Sig Start Date End Date Taking? Authorizing Provider  ciprofloxacin (CIPRO) 500 MG tablet Take 1 tablet (500 mg total) by mouth 2 (two) times daily. 08/17/16   Laurence Spates, MD    Allergies Patient has no known allergies.   REVIEW OF SYSTEMS  Negative except as noted here or in the History of Present Illness.   PHYSICAL EXAMINATION  Initial Vital Signs Blood pressure 120/78, pulse 68, temperature 98.7 F (37.1 C), temperature source Oral, resp. rate 16, height  (1.575 m), weight 132 lb (59.9 kg), last menstrual period 11/13/2016, SpO2 100 %, unknown if currently breastfeeding.  Examination General: Well-developed, well-nourished female in no acute distress; appearance consistent with age of record HENT: normocephalic; atraumatic Eyes: pupils equal, round and reactive to light; extraocular muscles intact Neck: supple Heart:  regular rate and rhythm Lungs: clear to auscultation bilaterally Abdomen: soft; nondistended; suprapubic tenderness; no masses or hepatosplenomegaly; bowel sounds present GU: Normal external genitalia; light vaginal bleeding; mild cervical motion tenderness; mild right adnexal tenderness; uterus not enlarged Extremities: No deformity; full range of motion; pulses normal Neurologic: Awake, alert and oriented; motor function intact in all extremities and symmetric; no facial droop Skin: Warm and dry Psychiatric: Normal mood and affect   RESULTS  Summary of this visit's results, reviewed by myself:   EKG Interpretation  Date/Time:    Ventricular Rate:    PR Interval:    QRS Duration:   QT Interval:    QTC Calculation:   R Axis:     Text Interpretation:        Laboratory Studies: Results for orders placed or performed during the hospital encounter of 11/14/16 (from the past 24 hour(s))  Pregnancy, urine     Status: None   Collection Time: 11/14/16 12:00 AM  Result Value Ref Range   Preg Test, Ur NEGATIVE NEGATIVE  Urinalysis, Routine w reflex microscopic     Status: Abnormal   Collection Time: 11/14/16 12:00 AM  Result Value Ref Range   Color, Urine YELLOW YELLOW   APPearance CLEAR CLEAR   Specific Gravity, Urine 1.016 1.005 - 1.030   pH 6.5 5.0 - 8.0   Glucose, UA NEGATIVE NEGATIVE mg/dL   Hgb urine dipstick MODERATE (A) NEGATIVE   Bilirubin Urine NEGATIVE NEGATIVE   Ketones, ur NEGATIVE NEGATIVE mg/dL   Protein, ur NEGATIVE NEGATIVE mg/dL  Nitrite NEGATIVE NEGATIVE   Leukocytes, UA NEGATIVE NEGATIVE  Urinalysis, Microscopic (reflex)     Status: Abnormal   Collection Time: 11/14/16 12:00 AM  Result Value Ref Range   RBC / HPF 0-5 0 - 5 RBC/hpf   WBC, UA 0-5 0 - 5 WBC/hpf   Bacteria, UA RARE (A) NONE SEEN   Squamous Epithelial / LPF 0-5 (A) NONE SEEN  Wet prep, genital     Status: Abnormal   Collection Time: 11/14/16  4:15 AM  Result Value Ref Range   Yeast Wet  Prep HPF POC NONE SEEN NONE SEEN   Trich, Wet Prep NONE SEEN NONE SEEN   Clue Cells Wet Prep HPF POC PRESENT (A) NONE SEEN   WBC, Wet Prep HPF POC MODERATE (A) NONE SEEN   Sperm NONE SEEN    Imaging Studies: No results found.  ED COURSE  Nursing notes and initial vitals signs, including pulse oximetry, reviewed.  Vitals:   11/13/16 2355 11/13/16 2358 11/14/16 0237  BP:  120/81 120/78  Pulse:  62 68  Resp:  18 16  Temp:  98.7 F (37.1 C)   TempSrc:  Oral   SpO2:  100% 100%  Weight: 132 lb (59.9 kg)    Height:  (1.575 m)     4:51 AM Patient may have had an early miscarriage. Her cervical os is closed and her uterus is not enlarged. Her pregnancy test is negative. We will treat for bacterial vaginosis as the patient has a history of frequent episodes of this.  PROCEDURES    ED DIAGNOSES     ICD-9-CM ICD-10-CM   1. BV (bacterial vaginosis) 616.10 N76.0    041.9 B96.89   2. Abnormal uterine bleeding 626.9 N93.9        [name], MD 11/14/16 [phone]

## 2016-11-14 NOTE — ED Notes (Signed)
Pt verbalizes understanding of d/c instructions and denies any further needs at this time. 

## 2016-12-27 ENCOUNTER — Encounter (HOSPITAL_BASED_OUTPATIENT_CLINIC_OR_DEPARTMENT_OTHER): Payer: Self-pay | Admitting: *Deleted

## 2016-12-27 ENCOUNTER — Emergency Department (HOSPITAL_BASED_OUTPATIENT_CLINIC_OR_DEPARTMENT_OTHER)
Admission: EM | Admit: 2016-12-27 | Discharge: 2016-12-27 | Disposition: A | Discharge status: Home / Self Care | Payer: Medicaid Other | Attending: Emergency Medicine | Admitting: Emergency Medicine

## 2016-12-27 DIAGNOSIS — N390 Urinary tract infection, site not specified: Secondary | ICD-10-CM | POA: Insufficient documentation

## 2016-12-27 LAB — PREGNANCY, URINE: PREG TEST UR: NEGATIVE

## 2016-12-27 LAB — URINALYSIS, ROUTINE W REFLEX MICROSCOPIC
Bilirubin Urine: NEGATIVE
Glucose, UA: NEGATIVE mg/dL
Hgb urine dipstick: NEGATIVE
Ketones, ur: NEGATIVE mg/dL
Nitrite: POSITIVE — AB
PROTEIN: NEGATIVE mg/dL
Specific Gravity, Urine: 1.014 (ref 1.005–1.030)
pH: 6.5 (ref 5.0–8.0)

## 2016-12-27 LAB — URINALYSIS, MICROSCOPIC (REFLEX): RBC / HPF: NONE SEEN RBC/hpf (ref 0–5)

## 2016-12-27 MED ORDER — CIPROFLOXACIN HCL 500 MG PO TABS: 500.0000 mg | ORAL_TABLET | Freq: Two times a day (BID) | ORAL | 0 refills | Status: DC

## 2016-12-27 MED FILL — CIPROFLOXACIN HCL 500 MG TA: 500 | 7 days supply | Qty: 14 | Fill #0

## 2016-12-27 NOTE — Discharge Instructions (Signed)
Please read attached information. If you experience any new or worsening signs or symptoms please return to the emergency room for evaluation. Please follow-up with your primary care provider or specialist as discussed. Please use medication prescribed only as directed and discontinue taking if you have any concerning signs or symptoms.   °

## 2016-12-27 NOTE — ED Provider Notes (Signed)
[facility]   CSN: [id number] Arrival date & time: 12/27/16  0846     History   Chief Complaint Chief Complaint  Patient presents with  . Dysuria    HPI Janice Graham is a 31 y.o. female.  HPI   31 year old female presents today with 4 days of dysuria. She notes symptoms are similar to previous urinary tract infection which she received Cipro that resolved her symptoms promptly. She notes yesterday she had subjective fever, none today. She reports pain in her right flank, denies any nausea vomiting, abdominal pain, or any other concerning signs or symptoms.  Past Medical History:  Diagnosis Date  . BV (bacterial vaginosis)   . Goiter   . UTI (lower urinary tract infection)     Patient Active Problem List   Diagnosis Date Noted  . [redacted] weeks gestation of pregnancy   . Abnormal findings on antenatal screening     Past Surgical History:  Procedure Laterality Date  . TONSILLECTOMY    . WISDOM TOOTH EXTRACTION      OB History    Gravida Para Term Preterm AB Living   3 2 2     2    SAB TAB Ectopic Multiple Live Births                   Home Medications    Prior to Admission medications   Medication Sig Start Date End Date Taking? Authorizing Provider  ciprofloxacin (CIPRO) 500 MG tablet Take 1 tablet (500 mg total) by mouth every 12 (twelve) hours. 12/27/16   Eyvonne Mechanic, PA-C    Family History History reviewed. No pertinent family history.  Social History Social History  Substance Use Topics  . Smoking status: Never Smoker  . Smokeless tobacco: Never Used  . Alcohol use No     Allergies   Patient has no known allergies.   Review of Systems Review of Systems  All other systems reviewed and are negative.    Physical Exam Updated Vital Signs BP 118/79 (BP Location: Right Arm)   Pulse 71   Temp 99 F (37.2 C) (Oral)   Resp 18   Ht 5\' 2"  (1.575 m)   Wt 59.9 kg (132 lb)   LMP 12/06/2016   SpO2 100%   BMI 24.14  kg/m   Physical Exam  Constitutional: She is oriented to person, place, and time. She appears well-developed and well-nourished.  HENT:  Head: Normocephalic and atraumatic.  Eyes: Conjunctivae are normal. Pupils are equal, round, and reactive to light. Right eye exhibits no discharge. Left eye exhibits no discharge. No scleral icterus.  Neck: Normal range of motion. No JVD present. No tracheal deviation present.  Pulmonary/Chest: Effort normal. No stridor.  Abdominal:  Abdomen soft nontender, no CVA tenderness  Neurological: She is alert and oriented to person, place, and time. Coordination normal.  Psychiatric: She has a normal mood and affect. Her behavior is normal. Judgment and thought content normal.  Nursing note and vitals reviewed.    ED Treatments / Results  Labs (all labs ordered are listed, but only abnormal results are displayed) Labs Reviewed  URINALYSIS, ROUTINE W REFLEX MICROSCOPIC - Abnormal; Notable for the following:       Result Value   APPearance CLOUDY (*)    Nitrite POSITIVE (*)    Leukocytes, UA SMALL (*)    All other components within normal limits  URINALYSIS, MICROSCOPIC (REFLEX) - Abnormal; Notable for the following:    Bacteria, UA  MANY (*)    Squamous Epithelial / LPF 0-5 (*)    All other components within normal limits  PREGNANCY, URINE    EKG  EKG Interpretation None       Radiology No results found.  Procedures Procedures (including critical care time)  Medications Ordered in ED Medications - No data to display   Initial Impression / Assessment and Plan / ED Course  I have reviewed the triage vital signs and the nursing notes.  Pertinent labs & imaging results that were available during my care of the patient were reviewed by me and considered in my medical decision making (see chart for details).      Final Clinical Impressions(s) / ED Diagnoses   Final diagnoses:  Urinary tract infection without hematuria, site  unspecified   31 year old female presents today with urinary tract infection. She is well-appearing afebrile no acute distress. Patient is having very minor right flank pain, no CVA tenderness. Patient will be treated with Cipro that she's had positive results with this in the past.Question early pyelo. Patient will be treated with primary care follow-up, return precautions. She verbalized understanding and agreement to today's plan.  New Prescriptions New Prescriptions   CIPROFLOXACIN (CIPRO) 500 MG TABLET    Take 1 tablet (500 mg total) by mouth every 12 (twelve) hours.     Eyvonne MechanicHedges, Brodyn Depuy, PA-C 12/27/16 [id number]    Lavera GuiseLiu, Dana Duo, MD 12/27/16 [phone]

## 2016-12-27 NOTE — ED Triage Notes (Signed)
Pt reports 4 days of her usual uti sx, dysuria, back pain, denies any fevers.

## 2017-03-21 ENCOUNTER — Emergency Department (HOSPITAL_BASED_OUTPATIENT_CLINIC_OR_DEPARTMENT_OTHER)
Admission: EM | Admit: 2017-03-21 | Discharge: 2017-03-21 | Disposition: A | Discharge status: Home / Self Care | Payer: Medicaid Other | Attending: Emergency Medicine | Admitting: Emergency Medicine

## 2017-03-21 ENCOUNTER — Encounter (HOSPITAL_BASED_OUTPATIENT_CLINIC_OR_DEPARTMENT_OTHER): Payer: Self-pay | Admitting: *Deleted

## 2017-03-21 DIAGNOSIS — O219 Vomiting of pregnancy, unspecified: Secondary | ICD-10-CM | POA: Insufficient documentation

## 2017-03-21 DIAGNOSIS — R197 Diarrhea, unspecified: Secondary | ICD-10-CM | POA: Insufficient documentation

## 2017-03-21 DIAGNOSIS — O26892 Other specified pregnancy related conditions, second trimester: Secondary | ICD-10-CM | POA: Insufficient documentation

## 2017-03-21 DIAGNOSIS — Z3A17 17 weeks gestation of pregnancy: Secondary | ICD-10-CM | POA: Diagnosis not present

## 2017-03-21 DIAGNOSIS — R1030 Lower abdominal pain, unspecified: Secondary | ICD-10-CM | POA: Diagnosis not present

## 2017-03-21 DIAGNOSIS — M549 Dorsalgia, unspecified: Secondary | ICD-10-CM | POA: Insufficient documentation

## 2017-03-21 LAB — COMPREHENSIVE METABOLIC PANEL
ALT: 15 U/L (ref 14–54)
ANION GAP: 10 (ref 5–15)
AST: 24 U/L (ref 15–41)
Albumin: 3.5 g/dL (ref 3.5–5.0)
Alkaline Phosphatase: 39 U/L (ref 38–126)
BUN: 9 mg/dL (ref 6–20)
CHLORIDE: 101 mmol/L (ref 101–111)
CO2: 22 mmol/L (ref 22–32)
Calcium: 8.6 mg/dL — ABNORMAL LOW (ref 8.9–10.3)
Creatinine, Ser: 0.45 mg/dL (ref 0.44–1.00)
Glucose, Bld: 93 mg/dL (ref 65–99)
POTASSIUM: 3.1 mmol/L — AB (ref 3.5–5.1)
SODIUM: 133 mmol/L — AB (ref 135–145)
Total Bilirubin: 0.5 mg/dL (ref 0.3–1.2)
Total Protein: 6.5 g/dL (ref 6.5–8.1)

## 2017-03-21 LAB — CBC
HCT: 28.9 % — ABNORMAL LOW (ref 36.0–46.0)
Hemoglobin: 10.3 g/dL — ABNORMAL LOW (ref 12.0–15.0)
MCH: 28.9 pg (ref 26.0–34.0)
MCHC: 35.6 g/dL (ref 30.0–36.0)
MCV: 81 fL (ref 78.0–100.0)
PLATELETS: 223 10*3/uL (ref 150–400)
RBC: 3.57 MIL/uL — AB (ref 3.87–5.11)
RDW: 14.2 % (ref 11.5–15.5)
WBC: 8.6 10*3/uL (ref 4.0–10.5)

## 2017-03-21 LAB — URINALYSIS, ROUTINE W REFLEX MICROSCOPIC
Bilirubin Urine: NEGATIVE
GLUCOSE, UA: NEGATIVE mg/dL
Hgb urine dipstick: NEGATIVE
KETONES UR: NEGATIVE mg/dL
Leukocytes, UA: NEGATIVE
Nitrite: NEGATIVE
PROTEIN: NEGATIVE mg/dL
Specific Gravity, Urine: 1.014 (ref 1.005–1.030)
pH: 6.5 (ref 5.0–8.0)

## 2017-03-21 LAB — PREGNANCY, URINE: PREG TEST UR: POSITIVE — AB

## 2017-03-21 LAB — LIPASE, BLOOD: LIPASE: 23 U/L (ref 11–51)

## 2017-03-21 LAB — HCG, QUANTITATIVE, PREGNANCY: hCG, Beta Chain, Quant, S: 56742 m[IU]/mL — ABNORMAL HIGH (ref <5)

## 2017-03-21 NOTE — ED Provider Notes (Signed)
I have personally seen and examined the patient. I have reviewed the documentation on PMH/FH/Soc Hx. I have discussed the plan of care with the resident and patient.  I have reviewed and agree with the resident's documentation. Please see associated encounter note.  I have personally seen and examined the patient and discussed plan of care with the resident.  I was present for entire procedure.  I have reviewed the appropriate documentation on PMH/FH/Soc. History.  I have reviewed the documentation of the resident and agree.          Nira Connardama, Pedro Eduardo, MD 03/21/17 [phone]

## 2017-03-21 NOTE — ED Notes (Signed)
Pt reports this is her 6th pregnancy (3 living children, 2 elective abortions).

## 2017-03-21 NOTE — Discharge Instructions (Signed)
Please keep taking your prenatal vitamins and call Dr. Tawni Levyorn's office as soon as possible to establish for prenatal care during this pregnancy.   Safe Medications in Pregnancy   Acne:  Benzoyl Peroxide  Salicylic Acid   Backache/Headache:  Tylenol: 2 regular strength every 4 hours OR               2 Extra strength every 6 hours   Colds/Coughs/Allergies:  Benadryl (alcohol free) 25 mg every 6 hours as needed  Breath right strips  Claritin  Cepacol throat lozenges  Chloraseptic throat spray  Cold-Eeze- up to three times per day  Cough drops, alcohol free  Flonase (by prescription only)  Guaifenesin  Mucinex  Robitussin DM (plain only, alcohol free)  Saline nasal spray/drops  Sudafed (pseudoephedrine) & Actifed * use only after [redacted] weeks gestation and if you do not have high blood pressure  Tylenol  Vicks Vaporub  Zinc lozenges  Zyrtec   Constipation:  Colace  Ducolax suppositories  Fleet enema  Glycerin suppositories  Metamucil  Milk of magnesia  Miralax  Senokot  Smooth move tea   Diarrhea:  Kaopectate  Imodium A-D   *NO pepto Bismol   Hemorrhoids:  Anusol  Anusol HC  Preparation H  Tucks   Indigestion:  Tums  Maalox  Mylanta  Zantac  Pepcid   Insomnia:  Benadryl (alcohol free) 25mg  every 6 hours as needed  Tylenol PM  Unisom, no Gelcaps   Leg Cramps:  Tums  MagGel   Nausea/Vomiting:  Bonine  Dramamine  Emetrol  Ginger extract  Sea bands  Meclizine  Nausea medication to take during pregnancy:  Unisom (doxylamine succinate 25 mg tablets) Take one tablet daily at bedtime. If symptoms are not adequately controlled, the dose can be increased to a maximum recommended dose of two tablets daily (1/2 tablet in the morning, 1/2 tablet mid-afternoon and one at bedtime).  Vitamin B6 100mg  tablets. Take one tablet twice a day (up to 200 mg per day).   Skin Rashes:  Aveeno products  Benadryl cream or 25mg  every 6 hours as needed  Calamine Lotion    1% cortisone cream   Yeast infection:  Gyne-lotrimin 7  Monistat 7    **If taking multiple medications, please check labels to avoid duplicating the same active ingredients  **take medication as directed on the label  ** Do not exceed 4000 mg of tylenol in 24 hours  **Do not take medications that contain aspirin or ibuprofen

## 2017-03-21 NOTE — ED Triage Notes (Signed)
Pt reports positive pregnancy test at home several months ago. States no prenatal care yet, so she is unsure how far along she is in her pregnancy. Reports LMP sometime in March. Presents today with intermittent stomach and back cramping since around 0300. Also reports n/v/d. Denies fever, vaginal bleeding/fluid leaking.

## 2017-03-21 NOTE — ED Provider Notes (Signed)
[facility]   CSN: [id number] Arrival date & time: 03/21/17  0708     History   Chief Complaint Chief Complaint  Patient presents with  . Emesis During Pregnancy  . Abdominal Pain    HPI Janice Graham is a 31 y.o. female.  Patient is a 31 yo F [id number] who presents to ED with back/abdominal pain. States woke up around 3am with back/abdominal pain and sweating. States having b/l mid back pain and b/l lower abdominal pain that feels crampy and is associated with NBNB vomiting x2, nausea, diarrhea. No sick contacts. States similar to morning sickness she has had with her other pregnancies but has not had any with this pregnancy. Of note, patient states her last period was sometime in March, uncertain of dates, and that she had a positive pregnancy test at home sometime in March/April although she is aware of 2 documented negative urine pregnancy tests in April and May. Plans to follow with Dr. Shawnie Pons for this pregnancy but has not yet called for appointment.      Past Medical History:  Diagnosis Date  . BV (bacterial vaginosis)   . Goiter   . UTI (lower urinary tract infection)     Patient Active Problem List   Diagnosis Date Noted  . [redacted] weeks gestation of pregnancy   . Abnormal findings on antenatal screening     Past Surgical History:  Procedure Laterality Date  . TONSILLECTOMY    . WISDOM TOOTH EXTRACTION      OB History    Gravida Para Term Preterm AB Living   4 2 2     2    SAB TAB Ectopic Multiple Live Births                   Home Medications    Prior to Admission medications   Medication Sig Start Date End Date Taking? Authorizing Provider  Prenatal Vit-Fe Fumarate-FA (MULTIVITAMIN-PRENATAL) 27-0.8 MG TABS tablet Take 1 tablet by mouth daily at 12 noon.   Yes [provider]  ciprofloxacin (CIPRO) 500 MG tablet Take 1 tablet (500 mg total) by mouth every 12 (twelve) hours. 12/27/16   Eyvonne Mechanic, PA-C    Family  History No family history on file.  Social History Social History  Substance Use Topics  . Smoking status: Never Smoker  . Smokeless tobacco: Never Used  . Alcohol use No     Allergies   Patient has no known allergies.   Review of Systems Review of Systems  Constitutional: Negative for chills and fever.  HENT: Negative for congestion.   Respiratory: Negative for shortness of breath.   Cardiovascular: Negative for chest pain.  Gastrointestinal: Positive for abdominal pain, diarrhea, nausea and vomiting. Negative for abdominal distention and constipation.  Genitourinary: Negative for difficulty urinating, dysuria, frequency, hematuria and urgency.  Musculoskeletal: Positive for back pain. Negative for myalgias.  Neurological: Negative for light-headedness and headaches.     Physical Exam Updated Vital Signs BP 113/72 (BP Location: Right Arm)   Pulse (!) 115   Temp 98.9 F (37.2 C) (Oral)   Resp 16   Ht 5\' 2"  (1.575 m)   Wt 65.8 kg (145 lb)   LMP 10/04/2016 (Approximate)   SpO2 99%   BMI 26.52 kg/m   Physical Exam  Constitutional: She is oriented to person, place, and time. She appears well-developed and well-nourished. No distress.  HENT:  Head: Normocephalic and atraumatic.  Nose: Nose normal.  Eyes: Pupils are  equal, round, and reactive to light. Conjunctivae and EOM are normal.  Neck: Normal range of motion. Neck supple.  Cardiovascular: Normal rate, regular rhythm, normal heart sounds and intact distal pulses.   No murmur heard. Pulmonary/Chest: Effort normal and breath sounds normal. No respiratory distress.  Abdominal: Soft. Bowel sounds are normal. There is no tenderness. There is no rebound and no guarding.  No CVA tenderness  Musculoskeletal: Normal range of motion.  Neurological: She is alert and oriented to person, place, and time.  Skin: Skin is warm and dry. Capillary refill takes less than 2 seconds.  Psychiatric: She has a normal mood and affect.      ED Treatments / Results  Labs (all labs ordered are listed, but only abnormal results are displayed) Labs Reviewed  PREGNANCY, URINE - Abnormal; Notable for the following:       Result Value   Preg Test, Ur POSITIVE (*)    All other components within normal limits  CBC - Abnormal; Notable for the following:    RBC 3.57 (*)    Hemoglobin 10.3 (*)    HCT 28.9 (*)    All other components within normal limits  COMPREHENSIVE METABOLIC PANEL - Abnormal; Notable for the following:    Sodium 133 (*)    Potassium 3.1 (*)    Calcium 8.6 (*)    All other components within normal limits  URINE CULTURE  URINALYSIS, ROUTINE W REFLEX MICROSCOPIC  HCG, QUANTITATIVE, PREGNANCY  LIPASE, BLOOD    EKG  EKG Interpretation None       Radiology No results found.  Procedures Procedures (including critical care time) EMERGENCY DEPARTMENT US PREGNANCY "Study: Limited Ultrasound of the Pelvis for Pregnancy"  INDICATIONS:Pregnancy(required) Multiple views of the uterus and pelvic cavity were obtained in real-time with a multi-frequency probe.  APPROACH:Transabdominal  PERFORMED BY: Myself IMAGES ARCHIVED?: Yes LIMITATIONS: none PREGNANCY FREE FLUID: None GESTATIONAL AGE, ESTIMATE: 5761w0d  FETAL HEART RATE: 167bpm INTERPRETATION: Intrauterine gestational sac noted and Fetal heart activity seen    Medications Ordered in ED Medications - No data to display   Initial Impression / Assessment and Plan / ED Course  I have reviewed the triage vital signs and the nursing notes.  Pertinent labs & imaging results that were available during my care of the patient were reviewed by me and considered in my medical decision making (see chart for details).   Patient is a [id number] who presented with back/abdominal pain, nausea, vomiting at home. Confirmed IUP that appears to be around 2061w0d by bedside ultrasound today. UA neg and no urinary symptoms. Urine culture sent. Able to tolerate po here  and is overall well appearing with stable vitals. Patient already on prenatal vitamins at home. Discussed establishing with Dr. Shawnie Ponsorn for this pregnancy and return precautions.   Final Clinical Impressions(s) / ED Diagnoses   Final diagnoses:  [redacted] weeks gestation of pregnancy    New Prescriptions Discharge Medication List as of 03/21/2017  8:56 AM       Leland HerYoo, Avon Mergenthaler J, DO 03/21/17 [phone]

## 2017-03-23 LAB — URINE CULTURE: Culture: 40000 — AB

## 2017-03-24 ENCOUNTER — Telehealth: Payer: Self-pay

## 2017-03-24 NOTE — Telephone Encounter (Signed)
Post ED Visit - Positive Culture Follow-up  Culture report reviewed by antimicrobial stewardship pharmacist:  [x]  Enzo Bi, Pharm.D. []  Celedonio Miyamoto, Pharm.D., BCPS AQ-ID []  Garvin Fila, Pharm.D., BCPS []  Georgina Pillion, Pharm.D., BCPS []  [place], [place].D., BCPS, AAHIVP []  Estella Husk, Pharm.D., BCPS, AAHIVP []  Lysle Pearl, PharmD, BCPS []  Casilda Carls, PharmD, BCPS []  Pollyann Samples, PharmD, BCPS  03/21/17 ED urine culture  and no further patient follow-up is required at this time.  Jerry Caras 03/24/2017, 9:55 AM

## 2018-01-19 IMAGING — CR DG CHEST 2V
2 series · 2 of 2 positions shown · non-contrast
Comparison: [DATE]

CLINICAL DATA: Sob x3days. Bladder infection. Nonsmoker.

EXAM:
CHEST - 2 VIEW

[w chest pa]
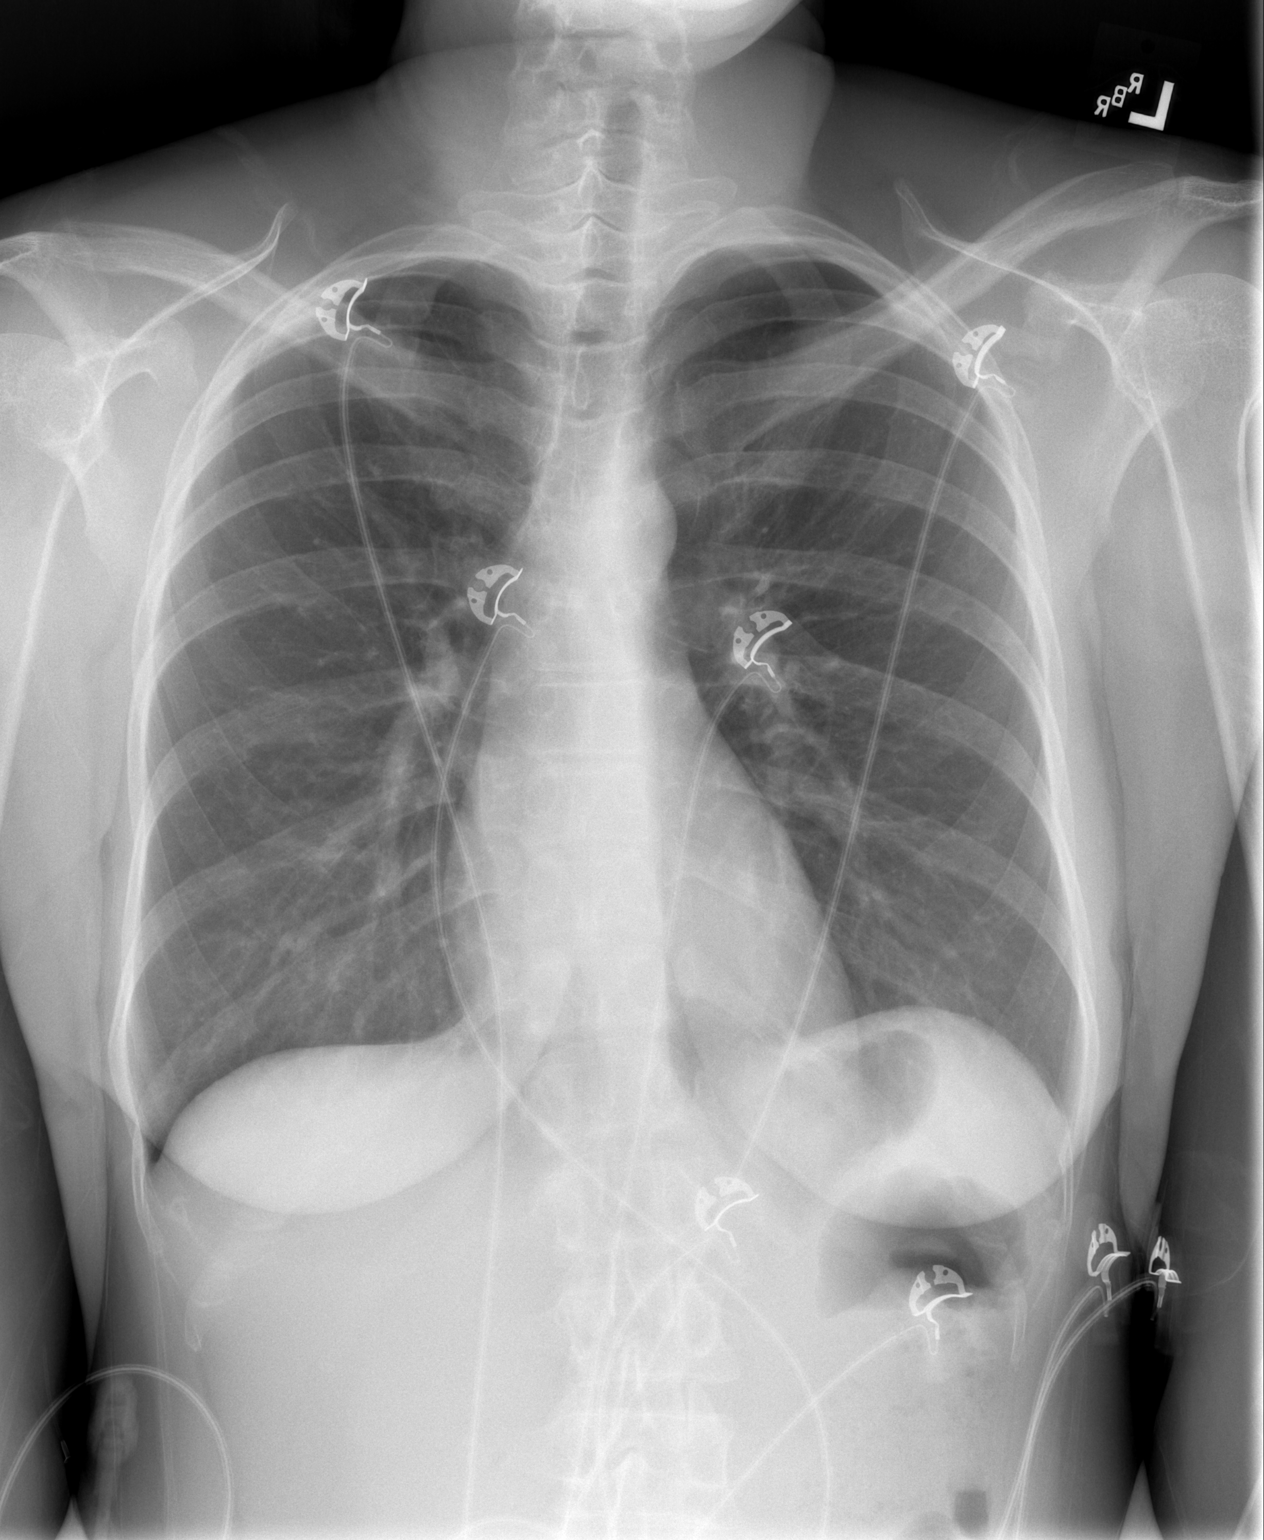

[w chest lat]
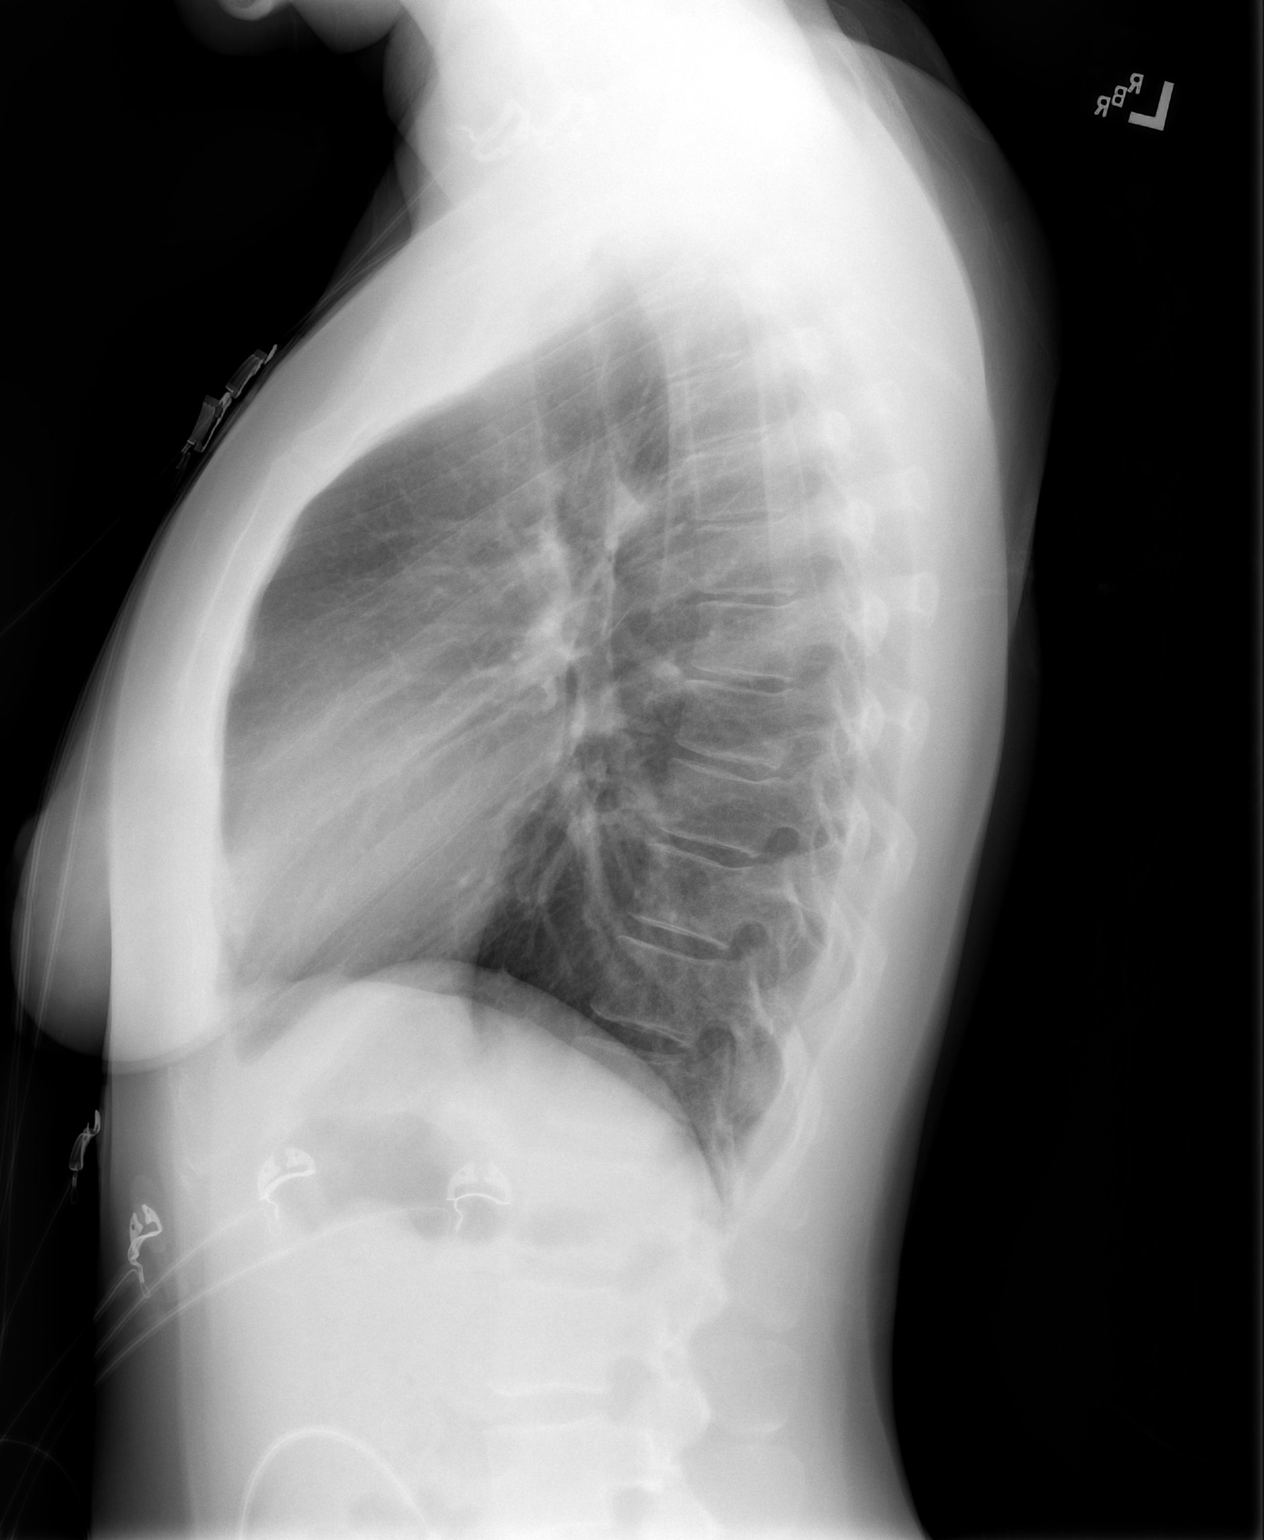

[2 of 2 positions shown; findings below may reference images not displayed]

FINDINGS: Lungs are clear.

Heart size and mediastinal contours are within normal limits.

No effusion.

Mild thoracic dextroscoliosis without evident underlying bony
anomaly.
IMPRESSION: No acute cardiopulmonary disease.

## 2018-02-05 ENCOUNTER — Encounter (HOSPITAL_BASED_OUTPATIENT_CLINIC_OR_DEPARTMENT_OTHER): Payer: Self-pay

## 2018-02-05 ENCOUNTER — Other Ambulatory Visit: Payer: Self-pay

## 2018-02-05 ENCOUNTER — Emergency Department (HOSPITAL_BASED_OUTPATIENT_CLINIC_OR_DEPARTMENT_OTHER)
Admission: EM | Admit: 2018-02-05 | Discharge: 2018-02-05 | Disposition: A | Discharge status: Home / Self Care | Payer: Self-pay | Attending: Emergency Medicine | Admitting: Emergency Medicine

## 2018-02-05 DIAGNOSIS — N898 Other specified noninflammatory disorders of vagina: Secondary | ICD-10-CM

## 2018-02-05 DIAGNOSIS — N760 Acute vaginitis: Secondary | ICD-10-CM | POA: Insufficient documentation

## 2018-02-05 DIAGNOSIS — B9689 Other specified bacterial agents as the cause of diseases classified elsewhere: Secondary | ICD-10-CM

## 2018-02-05 LAB — URINALYSIS, ROUTINE W REFLEX MICROSCOPIC
BILIRUBIN URINE: NEGATIVE
Glucose, UA: NEGATIVE mg/dL
HGB URINE DIPSTICK: NEGATIVE
Ketones, ur: NEGATIVE mg/dL
Leukocytes, UA: NEGATIVE
Nitrite: NEGATIVE
PH: 7 (ref 5.0–8.0)
Protein, ur: NEGATIVE mg/dL
SPECIFIC GRAVITY, URINE: 1.01 (ref 1.005–1.030)

## 2018-02-05 LAB — PREGNANCY, URINE: Preg Test, Ur: NEGATIVE

## 2018-02-05 LAB — WET PREP, GENITAL
SPERM: NONE SEEN
Trich, Wet Prep: NONE SEEN
Yeast Wet Prep HPF POC: NONE SEEN

## 2018-02-05 MED ORDER — CEFTRIAXONE SODIUM 250 MG IJ SOLR
250.0000 mg | Freq: Once | INTRAMUSCULAR | Status: AC
  Administered 2018-02-05: 250 mg via INTRAMUSCULAR
  Filled 2018-02-05: qty 250

## 2018-02-05 MED ORDER — AZITHROMYCIN 250 MG PO TABS
1000.0000 mg | ORAL_TABLET | Freq: Once | ORAL | Status: AC
  Administered 2018-02-05: 1000 mg via ORAL
  Filled 2018-02-05: qty 4

## 2018-02-05 MED ORDER — METRONIDAZOLE 0.75 % VA GEL
1.0000 | Freq: Two times a day (BID) | VAGINAL | 0 refills | Status: DC
Start: 2018-02-05 — End: 2018-09-12

## 2018-02-05 NOTE — ED Triage Notes (Signed)
C/o vaginal d/c x 3 days-NAD-steady gait 

## 2018-02-05 NOTE — Discharge Instructions (Signed)
You were seen here today for vaginal discharge.  Your pregnancy test was negative.  There was no evidence of a UTI, yeast infection or trichomonas.  Your wet prep did show evidence of bacterial vaginosis.  I am prescribing you Flagyl gel for this.  You are profoundly treated for gonorrhea and chlamydia.  Your test results are pending (gonorrhea, chlamydia, HIV and syphilis).  Please follow with your primary care doctor or OB/GYN. If you develop worsening or new concerning symptoms you can return to the emergency department for re-evaluation.   Additional Information:  Your vital signs today were: BP 111/68    Pulse 76    Temp 98.2 F (36.8 C) (Oral)    Resp 18    Ht 5\' 2"  (1.575 m)    Wt 59.9 kg (132 lb)    LMP 01/18/2018    SpO2 100%    Breastfeeding? Unknown    BMI 24.14 kg/m  If your blood pressure (BP) was elevated above 135/85 this visit, please have this repeated by your doctor within one month. ---------------

## 2018-02-05 NOTE — ED Provider Notes (Addendum)
[facility] EMERGENCY DEPARTMENT Provider Note   CSN: [id number] Arrival date & time: 02/05/18  1349     History   Chief Complaint Chief Complaint  Patient presents with  . Vaginal Discharge    HPI Janice Graham is a 32 y.o. female with a history of BV who presents emergency department today for vaginal discharge.  Patient reports that 4 days ago she started having a white/clear, foul-smelling, discharge.  She notes she is concerned for STDs and is wanting to be tested.  She notes that she has had some increased urinary frequency as well as burning with urination associate with this as well.  Patient notes no change in personal hygiene products.  She notes she is sexually active with one female partner does not always use protection.  She denies any associated abdominal pain, nausea/vomiting/diarrhea, vaginal bleeding, hematuria, urinary urgency, flank pain or fever.  She has not been taking anything for symptoms.  She notes nothing makes her symptoms better or worse.  LMP was 01/18/2018.  HPI  Past Medical History:  Diagnosis Date  . BV (bacterial vaginosis)   . Goiter   . UTI (lower urinary tract infection)     Patient Active Problem List   Diagnosis Date Noted  . [redacted] weeks gestation of pregnancy   . Abnormal findings on antenatal screening     Past Surgical History:  Procedure Laterality Date  . TONSILLECTOMY    . WISDOM TOOTH EXTRACTION       OB History    Gravida  4   Para  2   Term  2   Preterm      AB      Living  2     SAB      TAB      Ectopic      Multiple      Live Births               Home Medications    Prior to Admission medications   Medication Sig Start Date End Date Taking? Authorizing Provider  Prenatal Vit-Fe Fumarate-FA (MULTIVITAMIN-PRENATAL) 27-0.8 MG TABS tablet Take 1 tablet by mouth daily at 12 noon.    [provider]    Family History No family history on file.  Social History Social History    Tobacco Use  . Smoking status: Never Smoker  . Smokeless tobacco: Never Used  Substance Use Topics  . Alcohol use: No  . Drug use: No     Allergies   Patient has no known allergies.   Review of Systems Review of Systems  All other systems reviewed and are negative.    Physical Exam Updated Vital Signs BP 108/80 (BP Location: Right Arm)   Pulse 70   Temp 98.2 F (36.8 C) (Oral)   Resp 16   Ht 5\' 2"  (1.575 m)   Wt 59.9 kg (132 lb)   LMP 01/18/2018   SpO2 99%   Breastfeeding? Unknown   BMI 24.14 kg/m   Physical Exam  Constitutional: She appears well-developed and well-nourished.  HENT:  Head: Normocephalic and atraumatic.  Right Ear: External ear normal.  Left Ear: External ear normal.  Nose: Nose normal.  Mouth/Throat: Uvula is midline, oropharynx is clear and moist and mucous membranes are normal. No tonsillar exudate.  Eyes: Pupils are equal, round, and reactive to light. Right eye exhibits no discharge. Left eye exhibits no discharge. No scleral icterus.  Neck: Trachea normal. Neck supple. No spinous process tenderness  present. No neck rigidity. Normal range of motion present.  Cardiovascular: Normal rate, regular rhythm and intact distal pulses.  No murmur heard. Pulses:      Radial pulses are 2+ on the right side, and 2+ on the left side.       Dorsalis pedis pulses are 2+ on the right side, and 2+ on the left side.       Posterior tibial pulses are 2+ on the right side, and 2+ on the left side.  No lower extremity swelling or edema. Calves symmetric in size bilaterally.  Pulmonary/Chest: Effort normal and breath sounds normal. She exhibits no tenderness.  Abdominal: Soft. Bowel sounds are normal. She exhibits no distension. There is no tenderness. There is no rigidity, no rebound, no guarding and no CVA tenderness.  Genitourinary:  Genitourinary Comments: Exam performed by Jacinto HalimMichael M Shadrick Senne, exam chaperoned Pelvic exam: normal external genitalia without  evidence of trauma. VULVA: normal appearing vulva with no masses, tenderness or lesion. VAGINA: normal appearing vagina with normal color and discharge, no lesions. CERVIX: normal appearing cervix without lesions, cervical motion tenderness absent, cervical os closed with out purulent discharge; vaginal discharge - white and malodorous, Wet prep and DNA probe for chlamydia and GC obtained.   ADNEXA: normal adnexa in size, nontender and no masses UTERUS: uterus is normal size, shape, consistency and nontender.   Musculoskeletal: She exhibits no edema.  Lymphadenopathy:    She has no cervical adenopathy.  Neurological: She is alert.  Skin: Skin is warm and dry. No rash noted. She is not diaphoretic.  Psychiatric: She has a normal mood and affect.  Nursing note and vitals reviewed.    ED Treatments / Results  Labs (all labs ordered are listed, but only abnormal results are displayed) Labs Reviewed  WET PREP, GENITAL - Abnormal; Notable for the following components:      Result Value   Clue Cells Wet Prep HPF POC PRESENT (*)    WBC, Wet Prep HPF POC MANY (*)    All other components within normal limits  PREGNANCY, URINE  URINALYSIS, ROUTINE W REFLEX MICROSCOPIC  RPR  HIV ANTIBODY (ROUTINE TESTING)  GC/CHLAMYDIA PROBE AMP ([place]) NOT AT [facility]    EKG None  Radiology No results found.  Procedures Procedures (including critical care time)  Medications Ordered in ED Medications  cefTRIAXone (ROCEPHIN) injection 250 mg (has no administration in time range)  azithromycin (ZITHROMAX) tablet 1,000 mg (has no administration in time range)     Initial Impression / Assessment and Plan / ED Course  I have reviewed the triage vital signs and the nursing notes.  Pertinent labs & imaging results that were available during my care of the patient were reviewed by me and considered in my medical decision making (see chart for details).     32 y.o. female presenting with vaginal  discharge.  She denies any abdominal pain, fever, nausea/vomiting.  No abdominal tenderness on palpation.  No cervical motion or adnexal tenderness.  No concern for PID.  Vital signs are reassuring.  Pregnancy test is negative.  UA is without evidence of UTI.  Wet prep without evidence of trichomonas or yeast infection.  There is evidence of BV.  Patient is requesting topical Flagyl.  Patient requested prophylactic treatment for gonorrhea and chlamydia.  Cultures are pending.  Patient advised that HIV, syphilis, gonorrhea and Chlamydia cultures are pending.  Patient advised safe sex practices and is encouraged to follow-up with primary care doctor or OB/GYN.  Specific return precautions discussed.  She appears safe for discharge.  Final Clinical Impressions(s) / ED Diagnoses   Final diagnoses:  Vaginal discharge  BV (bacterial vaginosis)    ED Discharge Orders        Ordered    metroNIDAZOLE (METROGEL) 0.75 % vaginal gel  2 times daily     02/05/18 1728       Jacinto Halim, PA-C 02/05/18 1734    Hodges Treiber, Elmer Sow, PA-C 02/05/18 1734    Tilden Fossa, MD 02/06/18 [phone]

## 2018-02-05 NOTE — ED Notes (Signed)
Pt verbalizes understanding of d/c instructions and denies any further need at this time. 

## 2018-02-06 LAB — RPR: RPR Ser Ql: NONREACTIVE

## 2018-02-06 LAB — HIV ANTIBODY (ROUTINE TESTING W REFLEX): HIV Screen 4th Generation wRfx: NONREACTIVE

## 2018-02-07 LAB — GC/CHLAMYDIA PROBE AMP (~~LOC~~) NOT AT ARMC
Chlamydia: NEGATIVE
NEISSERIA GONORRHEA: NEGATIVE

## 2018-03-10 ENCOUNTER — Telehealth (HOSPITAL_BASED_OUTPATIENT_CLINIC_OR_DEPARTMENT_OTHER): Payer: Self-pay | Admitting: Emergency Medicine

## 2018-09-12 ENCOUNTER — Emergency Department (HOSPITAL_BASED_OUTPATIENT_CLINIC_OR_DEPARTMENT_OTHER)
Admission: EM | Admit: 2018-09-12 | Discharge: 2018-09-12 | Disposition: A | Discharge status: Home / Self Care | Payer: Self-pay | Attending: Emergency Medicine | Admitting: Emergency Medicine

## 2018-09-12 ENCOUNTER — Other Ambulatory Visit: Payer: Self-pay

## 2018-09-12 ENCOUNTER — Encounter (HOSPITAL_BASED_OUTPATIENT_CLINIC_OR_DEPARTMENT_OTHER): Payer: Self-pay | Admitting: *Deleted

## 2018-09-12 DIAGNOSIS — Z113 Encounter for screening for infections with a predominantly sexual mode of transmission: Secondary | ICD-10-CM | POA: Insufficient documentation

## 2018-09-12 DIAGNOSIS — N3000 Acute cystitis without hematuria: Secondary | ICD-10-CM | POA: Insufficient documentation

## 2018-09-12 DIAGNOSIS — N760 Acute vaginitis: Secondary | ICD-10-CM | POA: Insufficient documentation

## 2018-09-12 DIAGNOSIS — B9689 Other specified bacterial agents as the cause of diseases classified elsewhere: Secondary | ICD-10-CM | POA: Insufficient documentation

## 2018-09-12 LAB — URINALYSIS, ROUTINE W REFLEX MICROSCOPIC
Bilirubin Urine: NEGATIVE
GLUCOSE, UA: NEGATIVE mg/dL
HGB URINE DIPSTICK: NEGATIVE
Ketones, ur: NEGATIVE mg/dL
Nitrite: NEGATIVE
PH: 7 (ref 5.0–8.0)
PROTEIN: NEGATIVE mg/dL
Specific Gravity, Urine: 1.01 (ref 1.005–1.030)

## 2018-09-12 LAB — WET PREP, GENITAL
Sperm: NONE SEEN
TRICH WET PREP: NONE SEEN
YEAST WET PREP: NONE SEEN

## 2018-09-12 LAB — PREGNANCY, URINE: Preg Test, Ur: POSITIVE — AB

## 2018-09-12 LAB — URINALYSIS, MICROSCOPIC (REFLEX)

## 2018-09-12 MED ORDER — METRONIDAZOLE 0.75 % VA GEL: 1.0000 | Freq: Two times a day (BID) | VAGINAL | 0 refills | Status: DC

## 2018-09-12 MED ORDER — AZITHROMYCIN 250 MG PO TABS
1000.0000 mg | ORAL_TABLET | Freq: Once | ORAL | Status: AC
  Administered 2018-09-12: 1000 mg via ORAL
  Filled 2018-09-12: qty 4

## 2018-09-12 MED ORDER — NITROFURANTOIN MONOHYD MACRO 100 MG PO CAPS: 100.0000 mg | ORAL_CAPSULE | Freq: Two times a day (BID) | ORAL | 0 refills | Status: AC

## 2018-09-12 MED ORDER — CEFTRIAXONE SODIUM 250 MG IJ SOLR
250.0000 mg | Freq: Once | INTRAMUSCULAR | Status: AC
  Administered 2018-09-12: 250 mg via INTRAMUSCULAR
  Filled 2018-09-12: qty 250

## 2018-09-12 MED FILL — metroNIDAZOLE 0.75 % GEL: 0.75 | 7 days supply | Qty: 70 | Fill #0

## 2018-09-12 MED FILL — NITROFURANTOIN MONO-MCR 100: 100 | 5 days supply | Qty: 10 | Fill #0

## 2018-09-12 NOTE — Discharge Instructions (Addendum)
You have been seen today for urinary tract infection and vaginal discharge. Please read and follow all provided instructions. Return to the emergency room for worsening condition or new concerning symptoms.    1. Medications:  Prescription sent to the pharmacy here for metronidazole gel for your bacterial vaginosis. Macrobid for your UTI. Please take these medications as prescribed. You can ask the pharmacist about a probiotic.  These medications tomorrow.  Continue usual home medications Take medications as prescribed. Please review all of the medicines and only take them if you do not have an allergy to them.  2. Treatment: rest, drink plenty of fluids 3. Follow Up: It is important to follow-up with the clinic that performed your abortion.  Your pregnancy test today was positive and it is very important that you have a follow-up visit so they can check for retained products which could cause a serious infection. Please follow up with your primary doctor in 2-5 days for discussion of your diagnoses and further evaluation after today's visit; Call today to arrange your follow up.  If you do not have a primary care doctor use the resource guide provided to find one;   It is also a possibility that you have an allergic reaction to any of the medicines that you have been prescribed - Everybody reacts differently to medications and while MOST people have no trouble with most medicines, you may have a reaction such as nausea, vomiting, rash, swelling, shortness of breath. If this is the case, please stop taking the medicine immediately and contact your physician.  ?

## 2018-09-12 NOTE — ED Triage Notes (Signed)
Dysuria, urgency and vaginal discharge with odor.

## 2018-09-12 NOTE — ED Provider Notes (Signed)
[facility] EMERGENCY DEPARTMENT Provider Note   CSN: [id number] Arrival date & time: 09/12/18  1235     History   Chief Complaint Chief Complaint  Patient presents with  . Dysuria    HPI Janice Graham is a 33 y.o. female with a history of bacterial vaginosis and urinary tract infections presents to the emergency department today with chief complaint of vaginal discharge and dysuria.  Patient had a surgical abortion x3 weeks ago.   The vaginal discharge has been present x1 month.  She describes it as thin and white.  She also reports there is a fishy odor.  She feels similar to previous infections of BV.  Patient is also concerned for STDs.  She reports one female partner, however he was not tested prior to their relationship and was uncertain if he had any STDs. Patient also reports dysuria and urgency.  This has been present x10 days.  Patient estimates her last UTI was 9 to 10 months ago.  She has tried drinking cranberry juice without relief.  Other than that has not used any over-the-counter medications.  She has not been evaluated for this prior to arrival.  Denies hematuria, flank pain, abdominal pain, nausea, vomiting, fevers.  History provided by the patient.  Past Medical History:  Diagnosis Date  . BV (bacterial vaginosis)   . Goiter   . UTI (lower urinary tract infection)     Patient Active Problem List   Diagnosis Date Noted  . [redacted] weeks gestation of pregnancy   . Abnormal findings on antenatal screening     Past Surgical History:  Procedure Laterality Date  . TONSILLECTOMY    . WISDOM TOOTH EXTRACTION       OB History    Gravida  4   Para  2   Term  2   Preterm      AB      Living  2     SAB      TAB      Ectopic      Multiple      Live Births               Home Medications    Prior to Admission medications   Medication Sig Start Date End Date Taking? Authorizing Provider  metroNIDAZOLE (METROGEL) 0.75 % vaginal gel Place  1 Applicatorful vaginally 2 (two) times daily. 09/12/18   Naasir Carreira E, PA-C  nitrofurantoin, macrocrystal-monohydrate, (MACROBID) 100 MG capsule Take 1 capsule (100 mg total) by mouth 2 (two) times daily for 5 days. 09/12/18 09/17/18  Anayi Bricco, Caroleen HammanKaitlyn E, PA-C  Prenatal Vit-Fe Fumarate-FA (MULTIVITAMIN-PRENATAL) 27-0.8 MG TABS tablet Take 1 tablet by mouth daily at 12 noon.    [provider]    Family History No family history on file.  Social History Social History   Tobacco Use  . Smoking status: Never Smoker  . Smokeless tobacco: Never Used  Substance Use Topics  . Alcohol use: No  . Drug use: No     Allergies   Patient has no known allergies.   Review of Systems Review of Systems  Constitutional: Negative for chills and fever.  HENT: Negative for congestion, sinus pressure and sore throat.   Eyes: Negative for pain and visual disturbance.  Respiratory: Negative for chest tightness and shortness of breath.   Cardiovascular: Negative for chest pain and palpitations.  Gastrointestinal: Negative for abdominal pain, diarrhea, nausea and vomiting.  Genitourinary: Positive for dyspareunia, urgency and vaginal discharge.  Negative for difficulty urinating, dysuria, flank pain, frequency, hematuria, pelvic pain, vaginal bleeding and vaginal pain.  Musculoskeletal: Negative for back pain and neck pain.  Skin: Negative for rash and wound.  Neurological: Negative for syncope and headaches.     Physical Exam Updated Vital Signs BP 120/72 (BP Location: Right Arm)   Pulse 65   Temp 98.1 F (36.7 C) (Oral)   Resp 16   Ht 5\' 2"  (1.575 m)   Wt 61.2 kg   LMP 08/22/2018   SpO2 99%   BMI 24.69 kg/m   Physical Exam Vitals signs and nursing note reviewed.  Constitutional:      Appearance: She is not ill-appearing or toxic-appearing.  HENT:     Head: Normocephalic and atraumatic.     Nose: Nose normal.     Mouth/Throat:     Mouth: Mucous membranes are moist.      Pharynx: Oropharynx is clear.  Eyes:     Conjunctiva/sclera: Conjunctivae normal.  Neck:     Musculoskeletal: Normal range of motion.  Cardiovascular:     Rate and Rhythm: Normal rate and regular rhythm.     Pulses: Normal pulses.     Heart sounds: Normal heart sounds.  Pulmonary:     Effort: Pulmonary effort is normal.     Breath sounds: Normal breath sounds.  Abdominal:     General: There is no distension.     Palpations: Abdomen is soft.     Tenderness: There is no abdominal tenderness. There is no right CVA tenderness, left CVA tenderness, guarding or rebound.     Comments: No peritoneal signs.  Genitourinary:    Comments: exam chaperoned. Pelvic exam: normal external genitalia without evidence of trauma. VULVA: normal appearing vulva with no masses, tenderness or lesion. VAGINA: normal appearing vagina with normal color and discharge, no lesions. CERVIX: normal appearing cervix without lesions, cervical motion tenderness absent, cervical os closed with out purulent discharge; vaginal discharge is thin and gray, there is a strong fishy odor. Wet prep and DNA probe for chlamydia and GC obtained.   ADNEXA: normal adnexa in size, nontender and no masses UTERUS: non tender   Musculoskeletal: Normal range of motion.  Skin:    General: Skin is warm and dry.     Capillary Refill: Capillary refill takes less than 2 seconds.  Neurological:     Mental Status: She is alert. Mental status is at baseline.     Motor: No weakness.  Psychiatric:        Behavior: Behavior normal.      ED Treatments / Results  Labs (all labs ordered are listed, but only abnormal results are displayed) Labs Reviewed  WET PREP, GENITAL - Abnormal; Notable for the following components:      Result Value   Clue Cells Wet Prep HPF POC PRESENT (*)    WBC, Wet Prep HPF POC MODERATE (*)    All other components within normal limits  URINALYSIS, ROUTINE W REFLEX MICROSCOPIC - Abnormal; Notable for the  following components:   Leukocytes, UA SMALL (*)    All other components within normal limits  PREGNANCY, URINE - Abnormal; Notable for the following components:   Preg Test, Ur POSITIVE (*)    All other components within normal limits  URINALYSIS, MICROSCOPIC (REFLEX) - Abnormal; Notable for the following components:   Bacteria, UA MANY (*)    All other components within normal limits  RPR  HIV ANTIBODY (ROUTINE TESTING W REFLEX)  GC/CHLAMYDIA PROBE AMP (  [place]) NOT AT [facility]    EKG None  Radiology No results found.  Procedures Procedures (including critical care time)  Medications Ordered in ED Medications  cefTRIAXone (ROCEPHIN) injection 250 mg (250 mg Intramuscular Given 09/12/18 1451)  azithromycin (ZITHROMAX) tablet 1,000 mg (1,000 mg Oral Given 09/12/18 1451)     Initial Impression / Assessment and Plan / ED Course  I have reviewed the triage vital signs and the nursing notes.  Pertinent labs & imaging results that were available during my care of the patient were reviewed by me and considered in my medical decision making (see chart for details).    She is afebrile, non toxic appearing. Patient had surgical abortion x3 weeks ago.  She did not go back for follow-up visit.  She was supposed to go this week.  Her pregnancy test today is positive, but she was told she may have a positive pregnancy test for up to 4 weeks.  I stressed the importance of following up at the clinic as soon as possible. Her exam today is not suggestive for retained products of conception since she does not have vaginal bleeding, fever, pelvic pain, abdominal pain.   Pt also with concerns for possible STD.  Pt understands that she has GC/Chlamydia cultures pending and that they will need to inform all sexual partners if results return positive. Pt has been treated prophylactically with azithromycin and Rocephin due to pts history, pelvic exam, and wet prep with increased WBCs. Pt not concerning for  PID because hemodynamically stable and no cervical motion tenderness on pelvic exam. Pt has also been treated with Flagyl for Bacterial Vaginosis. She has used metronidazole gel in the past and would like prescription for same. Discussed importance of using protection when sexually active.   Patient has urinary frequency and dysuria.  Her UA shows small leukocytes, many bacteria and 6-10 WBCs.  Given her symptoms and the UA will treat for UTI with Macrobid.  Discussed strict ED return precautions. Pt verbalized understanding of and is in agreement with this plan. Pt stable for discharge home at this time.   Final Clinical Impressions(s) / ED Diagnoses   Final diagnoses:  BV (bacterial vaginosis)  Acute cystitis without hematuria    ED Discharge Orders         Ordered    metroNIDAZOLE (METROGEL) 0.75 % vaginal gel  2 times daily     09/12/18 1451    nitrofurantoin, macrocrystal-monohydrate, (MACROBID) 100 MG capsule  2 times daily     09/12/18 1451           Shakyla Nolley, Mliss SaxKaitlyn E, PA-C 09/13/18 0020    Gwyneth SproutPlunkett, [place], MD 09/14/18 [phone]

## 2018-09-13 LAB — HIV ANTIBODY (ROUTINE TESTING W REFLEX): HIV SCREEN 4TH GENERATION: NONREACTIVE

## 2018-09-13 LAB — RPR: RPR Ser Ql: NONREACTIVE

## 2018-09-15 LAB — GC/CHLAMYDIA PROBE AMP (~~LOC~~) NOT AT ARMC
Chlamydia: NEGATIVE
NEISSERIA GONORRHEA: NEGATIVE

## 2019-01-09 ENCOUNTER — Emergency Department (HOSPITAL_BASED_OUTPATIENT_CLINIC_OR_DEPARTMENT_OTHER)
Admission: EM | Admit: 2019-01-09 | Discharge: 2019-01-09 | Disposition: A | Discharge status: Home / Self Care | Payer: Self-pay | Attending: Emergency Medicine | Admitting: Emergency Medicine

## 2019-01-09 ENCOUNTER — Encounter (HOSPITAL_BASED_OUTPATIENT_CLINIC_OR_DEPARTMENT_OTHER): Payer: Self-pay | Admitting: *Deleted

## 2019-01-09 ENCOUNTER — Emergency Department (HOSPITAL_BASED_OUTPATIENT_CLINIC_OR_DEPARTMENT_OTHER): Payer: Self-pay

## 2019-01-09 ENCOUNTER — Other Ambulatory Visit: Payer: Self-pay

## 2019-01-09 DIAGNOSIS — R0602 Shortness of breath: Secondary | ICD-10-CM | POA: Insufficient documentation

## 2019-01-09 DIAGNOSIS — Z20828 Contact with and (suspected) exposure to other viral communicable diseases: Secondary | ICD-10-CM | POA: Insufficient documentation

## 2019-01-09 DIAGNOSIS — R1013 Epigastric pain: Secondary | ICD-10-CM | POA: Insufficient documentation

## 2019-01-09 DIAGNOSIS — R072 Precordial pain: Secondary | ICD-10-CM | POA: Insufficient documentation

## 2019-01-09 LAB — CBC WITH DIFFERENTIAL/PLATELET
Abs Immature Granulocytes: 0.01 10*3/uL (ref 0.00–0.07)
Basophils Absolute: 0 10*3/uL (ref 0.0–0.1)
Basophils Relative: 0 %
Eosinophils Absolute: 0 10*3/uL (ref 0.0–0.5)
Eosinophils Relative: 1 %
HCT: 31.8 % — ABNORMAL LOW (ref 36.0–46.0)
Hemoglobin: 10.5 g/dL — ABNORMAL LOW (ref 12.0–15.0)
Immature Granulocytes: 0 %
Lymphocytes Relative: 55 %
Lymphs Abs: 2.1 10*3/uL (ref 0.7–4.0)
MCH: 28.1 pg (ref 26.0–34.0)
MCHC: 33 g/dL (ref 30.0–36.0)
MCV: 85 fL (ref 80.0–100.0)
Monocytes Absolute: 0.3 10*3/uL (ref 0.1–1.0)
Monocytes Relative: 8 %
Neutro Abs: 1.5 10*3/uL — ABNORMAL LOW (ref 1.7–7.7)
Neutrophils Relative %: 36 %
Platelets: 234 10*3/uL (ref 150–400)
RBC: 3.74 MIL/uL — ABNORMAL LOW (ref 3.87–5.11)
RDW: 13.3 % (ref 11.5–15.5)
WBC: 4 10*3/uL (ref 4.0–10.5)
nRBC: 0 % (ref 0.0–0.2)

## 2019-01-09 LAB — COMPREHENSIVE METABOLIC PANEL
ALT: 14 U/L (ref 0–44)
AST: 18 U/L (ref 15–41)
Albumin: 4.1 g/dL (ref 3.5–5.0)
Alkaline Phosphatase: 44 U/L (ref 38–126)
Anion gap: 6 (ref 5–15)
BUN: 13 mg/dL (ref 6–20)
CO2: 27 mmol/L (ref 22–32)
Calcium: 9 mg/dL (ref 8.9–10.3)
Chloride: 106 mmol/L (ref 98–111)
Creatinine, Ser: 0.6 mg/dL (ref 0.44–1.00)
GFR calc Af Amer: >60 mL/min (ref >60)
GFR calc non Af Amer: >60 mL/min (ref >60)
Glucose, Bld: 105 mg/dL — ABNORMAL HIGH (ref 70–99)
Potassium: 3.4 mmol/L — ABNORMAL LOW (ref 3.5–5.1)
Sodium: 139 mmol/L (ref 135–145)
Total Bilirubin: 0.5 mg/dL (ref 0.3–1.2)
Total Protein: 6.8 g/dL (ref 6.5–8.1)

## 2019-01-09 LAB — URINALYSIS, ROUTINE W REFLEX MICROSCOPIC
Bilirubin Urine: NEGATIVE
Glucose, UA: NEGATIVE mg/dL
Ketones, ur: NEGATIVE mg/dL
Nitrite: NEGATIVE
Protein, ur: NEGATIVE mg/dL
Specific Gravity, Urine: 1.025 (ref 1.005–1.030)
pH: 6 (ref 5.0–8.0)

## 2019-01-09 LAB — TROPONIN I: Troponin I: <0.03 ng/mL (ref <0.03)

## 2019-01-09 LAB — URINALYSIS, MICROSCOPIC (REFLEX): RBC / HPF: >50 RBC/hpf (ref 0–5)

## 2019-01-09 LAB — PREGNANCY, URINE: Preg Test, Ur: NEGATIVE

## 2019-01-09 LAB — LIPASE, BLOOD: Lipase: 31 U/L (ref 11–51)

## 2019-01-09 LAB — D-DIMER, QUANTITATIVE: D-Dimer, Quant: 0.33 ug/mL-FEU (ref 0.00–0.50)

## 2019-01-09 MED ORDER — SUCRALFATE 1 GM/10ML PO SUSP
ORAL | Status: AC
  Filled 2019-01-09: qty 10

## 2019-01-09 MED ORDER — ALUM & MAG HYDROXIDE-SIMETH 200-200-20 MG/5ML PO SUSP: 30.0000 mL | Freq: Once | ORAL | Status: DC

## 2019-01-09 MED ORDER — FAMOTIDINE 40 MG PO TABS: 40.0000 mg | ORAL_TABLET | Freq: Every day | ORAL | 0 refills | Status: DC

## 2019-01-09 MED ORDER — SUCRALFATE 1 GM/10ML PO SUSP
1.0000 g | Freq: Once | ORAL | Status: AC
  Administered 2019-01-09: 1 g via ORAL

## 2019-01-09 MED ORDER — DICLOFENAC SODIUM 1 % TD GEL: 2.0000 g | Freq: Four times a day (QID) | TRANSDERMAL | 0 refills | Status: AC

## 2019-01-09 MED ORDER — SODIUM CHLORIDE 0.9 % IV BOLUS
500.0000 mL | Freq: Once | INTRAVENOUS | Status: AC
  Administered 2019-01-09: 500 mL via INTRAVENOUS

## 2019-01-09 MED ORDER — IBUPROFEN 800 MG PO TABS: 800.0000 mg | ORAL_TABLET | Freq: Three times a day (TID) | ORAL | 0 refills | Status: DC | PRN

## 2019-01-09 NOTE — Discharge Instructions (Signed)
Person Under Monitoring Name: Janice Graham  Location: [address] [place] [address]   Infection Prevention Recommendations for Individuals Confirmed to have, or Being Evaluated for, 2019 Novel Coronavirus (COVID-19) Infection Who Receive Care at Home  Individuals who are confirmed to have, or are being evaluated for, COVID-19 should follow the prevention steps below until a healthcare provider or local or state health department says they can return to normal activities.  Stay home except to get medical care You should restrict activities outside your home, except for getting medical care. Do not go to work, school, or public areas, and do not use public transportation or taxis.  Call ahead before visiting your doctor Before your medical appointment, call the healthcare provider and tell them that you have, or are being evaluated for, COVID-19 infection. This will help the healthcare providers office take steps to keep other people from getting infected. Ask your healthcare provider to call the local or state health department.  Monitor your symptoms Seek prompt medical attention if your illness is worsening (e.g., difficulty breathing). Before going to your medical appointment, call the healthcare provider and tell them that you have, or are being evaluated for, COVID-19 infection. Ask your healthcare provider to call the local or state health department.  Wear a facemask You should wear a facemask that covers your nose and mouth when you are in the same room with other people and when you visit a healthcare provider. People who live with or visit you should also wear a facemask while they are in the same room with you.  Separate yourself from other people in your home As much as possible, you should stay in a different room from other people in your home. Also, you should use a separate bathroom, if available.  Avoid sharing household items You should not share  dishes, drinking glasses, cups, eating utensils, towels, bedding, or other items with other people in your home. After using these items, you should wash them thoroughly with soap and water.  Cover your coughs and sneezes Cover your mouth and nose with a tissue when you cough or sneeze, or you can cough or sneeze into your sleeve. Throw used tissues in a lined trash can, and immediately wash your hands with soap and water for at least 20 seconds or use an alcohol-based hand rub.  Wash your [employer] your hands often and thoroughly with soap and water for at least 20 seconds. You can use an alcohol-based hand sanitizer if soap and water are not available and if your hands are not visibly dirty. Avoid touching your eyes, nose, and mouth with unwashed hands.   Prevention Steps for Caregivers and Household Members of Individuals Confirmed to have, or Being Evaluated for, COVID-19 Infection Being Cared for in the Home  If you live with, or provide care at home for, a person confirmed to have, or being evaluated for, COVID-19 infection please follow these guidelines to prevent infection:  Follow healthcare providers instructions Make sure that you understand and can help the patient follow any healthcare provider instructions for all care.  Provide for the patients basic needs You should help the patient with basic needs in the home and provide support for getting groceries, prescriptions, and other personal needs.  Monitor the patients symptoms If they are getting sicker, call his or her medical provider and tell them that the patient has, or is being evaluated for, COVID-19 infection. This will help the healthcare providers office  take steps to keep other people from getting infected. °Ask the healthcare provider to call the local or state health department. ° °Limit the number of people who have contact with the patient °If possible, have only one caregiver for the patient. °Other  household members should stay in another home or place of residence. If this is not possible, they should stay °in another room, or be separated from the patient as much as possible. Use a separate bathroom, if available. °Restrict visitors who do not have an essential need to be in the home. ° °Keep older adults, very young children, and other sick people away from the patient °Keep older adults, very young children, and those who have compromised immune systems or chronic health conditions away from the patient. This includes people with chronic heart, lung, or kidney conditions, diabetes, and cancer. ° °Ensure good ventilation °Make sure that shared spaces in the home have good air flow, such as from an air conditioner or an opened window, °weather permitting. ° °Wash your hands often °Wash your hands often and thoroughly with soap and water for at least 20 seconds. You can use an alcohol based hand sanitizer if soap and water are not available and if your hands are not visibly dirty. °Avoid touching your eyes, nose, and mouth with unwashed hands. °Use disposable paper towels to dry your hands. If not available, use dedicated cloth towels and replace them when they become wet. ° °Wear a facemask and gloves °Wear a disposable facemask at all times in the room and gloves when you touch or have contact with the patient’s blood, body fluids, and/or secretions or excretions, such as sweat, saliva, sputum, nasal mucus, vomit, urine, or feces.  Ensure the mask fits over your nose and mouth tightly, and do not touch it during use. °Throw out disposable facemasks and gloves after using them. Do not reuse. °Wash your hands immediately after removing your facemask and gloves. °If your personal clothing becomes contaminated, carefully remove clothing and launder. Wash your hands after handling contaminated clothing. °Place all used disposable facemasks, gloves, and other waste in a lined container before disposing them with  other household waste. °Remove gloves and wash your hands immediately after handling these items. ° °Do not share dishes, glasses, or other household items with the patient °Avoid sharing household items. You should not share dishes, drinking glasses, cups, eating utensils, towels, bedding, or other items with a patient who is confirmed to have, or being evaluated for, COVID-19 infection. °After the person uses these items, you should wash them thoroughly with soap and water. ° °Wash laundry thoroughly °Immediately remove and wash clothes or bedding that have blood, body fluids, and/or secretions or excretions, such as sweat, saliva, sputum, nasal mucus, vomit, urine, or feces, on them. °Wear gloves when handling laundry from the patient. °Read and follow directions on labels of laundry or clothing items and detergent. In general, wash and dry with the warmest temperatures recommended on the label. ° °Clean all areas the individual has used often °Clean all touchable surfaces, such as counters, tabletops, doorknobs, bathroom fixtures, toilets, phones, keyboards, tablets, and bedside tables, every day. Also, clean any surfaces that may have blood, body fluids, and/or secretions or excretions on them. °Wear gloves when cleaning surfaces the patient has come in contact with. °Use a diluted bleach solution (e.g., dilute bleach with 1 part bleach and 10 parts water) or a household disinfectant with a label that says EPA-registered for coronaviruses. To make a bleach   solution at home, add 1 tablespoon of bleach to 1 quart (4 cups) of water. For a larger supply, add  cup of bleach to 1 gallon (16 cups) of water. Read labels of cleaning products and follow recommendations provided on product labels. Labels contain instructions for safe and effective use of the cleaning product including precautions you should take when applying the product, such as wearing gloves or eye protection and making sure you have good ventilation  during use of the product. Remove gloves and wash hands immediately after cleaning.  Monitor yourself for signs and symptoms of illness Caregivers and household members are considered close contacts, should monitor their health, and will be asked to limit movement outside of the home to the extent possible. Follow the monitoring steps for close contacts listed on the symptom monitoring form.   ? If you have additional questions, contact your local health department or call the epidemiologist on call at [phone] (available 24/7). ? This guidance is subject to change. For the most up-to-date guidance from [facility], please refer to their website: [email or web address]

## 2019-01-09 NOTE — ED Provider Notes (Signed)
Emergency Department Provider Note   I have reviewed the triage vital signs and the nursing notes.   HISTORY  Chief Complaint Abdominal Pain   HPI Janice Graham is a 33 y.o. female with no significant PMH presents to the emergency department for evaluation of chest discomfort.  Patient describes  symptoms worsening over the past 2 days.  She describes midsternal chest pain radiating slightly to the left chest.  She does have some associated shortness of breath without cough or fever.  She is also describing some epigastric abdominal discomfort.  She feels worse with lying flat.  Symptoms are not particularly worse at night or with eating.  Patient to her abdominal discomfort.  She did try Tums with no relief.  She is currently on her menstrual cycle.  Denies any lower abdominal discomfort.  She does note a history of frequent UTI and states that she could have 1 but is not having significant symptoms at this time.  No fevers.  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    Past Medical History:  Diagnosis Date  . BV (bacterial vaginosis)   . Goiter   . UTI (lower urinary tract infection)     Patient Active Problem List   Diagnosis Date Noted  . [redacted] weeks gestation of pregnancy   . Abnormal findings on antenatal screening     Past Surgical History:  Procedure Laterality Date  . TONSILLECTOMY    . WISDOM TOOTH EXTRACTION      Allergies Patient has no known allergies.  History reviewed. No pertinent family history.  Social History Social History   Tobacco Use  . Smoking status: Never Smoker  . Smokeless tobacco: Never Used  Substance Use Topics  . Alcohol use: No  . Drug use: No    Review of Systems  Constitutional: No fever/chills Eyes: No visual changes. ENT: No sore throat. Cardiovascular: Positive chest pain. Respiratory: Positive shortness of breath. Gastrointestinal: Positive epigastric abdominal pain.   No nausea, no vomiting.  No diarrhea.  No constipation. Genitourinary: Negative for dysuria. Musculoskeletal: Negative for back pain. Skin: Negative for rash. Neurological: Negative for headaches, focal weakness or numbness.  10-point ROS otherwise negative.  ____________________________________________   PHYSICAL EXAM:  VITAL SIGNS: ED Triage Vitals  Enc Vitals Group     BP 01/09/19 1654 126/88     Pulse Rate 01/09/19 1654 66     Resp 01/09/19 1654 18     Temp 01/09/19 1654 98.6 F (37 C)     Temp Source 01/09/19 1654 Oral     SpO2 01/09/19 1654 100 %     Weight 01/09/19 1656 135 lb (61.2 kg)     Height 01/09/19 1656 5\' 2"  (1.575 m)     Pain Score 01/09/19 1656 8   Constitutional: Alert and oriented. Well appearing and in no acute distress. Eyes: Conjunctivae are normal.  Head: Atraumatic. Nose: No congestion/rhinnorhea. Mouth/Throat: Mucous membranes are moist.   Neck: No stridor.   Cardiovascular: Normal rate, regular rhythm. Good peripheral circulation. Grossly normal heart sounds.   Respiratory: Normal respiratory effort.  No retractions. Lungs CTAB. Gastrointestinal: Soft with mild epigastric abdominal tenderness. No rebound or guarding. No distention.  Musculoskeletal: No lower extremity tenderness nor edema. No gross deformities of extremities. Some tenderness noted over the mid sternum and left lateral chest with patient wincing on palpation.  Neurologic:  Normal speech and language. No gross focal neurologic deficits are appreciated.  Skin:  Skin is warm, dry and intact. No rash noted.   ____________________________________________   LABS (all labs ordered are listed, but only abnormal results are displayed)  Labs Reviewed  COMPREHENSIVE METABOLIC PANEL - Abnormal; Notable for the following components:      Result Value   Potassium 3.4 (*)    Glucose, Bld 105 (*)    All other components within normal limits  CBC WITH DIFFERENTIAL/PLATELET - Abnormal;  Notable for the following components:   RBC 3.74 (*)    Hemoglobin 10.5 (*)    HCT 31.8 (*)    Neutro Abs 1.5 (*)    All other components within normal limits  URINALYSIS, ROUTINE W REFLEX MICROSCOPIC - Abnormal; Notable for the following components:   Color, Urine PINK (*)    APPearance CLOUDY (*)    Hgb urine dipstick LARGE (*)    Leukocytes,Ua SMALL (*)    All other components within normal limits  URINALYSIS, MICROSCOPIC (REFLEX) - Abnormal; Notable for the following components:   Bacteria, UA MANY (*)    All other components within normal limits  NOVEL CORONAVIRUS, NAA (HOSPITAL ORDER, SEND-OUT TO REF LAB)  LIPASE, BLOOD  TROPONIN I  D-DIMER, QUANTITATIVE (NOT AT [facility])  PREGNANCY, URINE   ____________________________________________  EKG   EKG Interpretation  Date/Time:  Friday January 09 2019 16:53:03 EDT Ventricular Rate:  67 PR Interval:  132 QRS Duration: 86 QT Interval:  398 QTC Calculation: 420 R Axis:   75 Text Interpretation:  Normal sinus rhythm with sinus arrhythmia Normal ECG No STEMI  Confirmed by Alona Bene [id number]) on 01/09/2019 4:54:21 PM       ____________________________________________  RADIOLOGY  Dg Chest Portable 1 View  Result Date: 01/09/2019 CLINICAL DATA:  Left-sided chest pain EXAM: PORTABLE CHEST 1 VIEW COMPARISON:  08/17/2016 FINDINGS: The heart size and mediastinal contours are within normal limits. Both lungs are clear. The visualized skeletal structures are unremarkable. IMPRESSION: No acute abnormality of the lungs in AP portable projection. Electronically Signed   By: Lauralyn Primes M.D.   On: 01/09/2019 17:54    ____________________________________________   PROCEDURES  Procedure(s) performed:   Procedures  None  ____________________________________________   INITIAL IMPRESSION / ASSESSMENT AND PLAN / ED COURSE  Pertinent labs & imaging results that were available during my care of the patient were reviewed by me and  considered in my medical decision making (see chart for details).   Patient presents to the emergency department for evaluation of chest and abdominal discomfort.  She notes some shortness of breath symptoms.  She is in close contact with COVID positive individuals as she is working at a nursing home.  No peritoneal findings on abdominal exam.  Differential is broad at this time and includes GERD, peptic ulcer disease, PE.  Plan for screening lab work including d-dimer along with chest x-ray.  Patient also with some tenderness over the chest wall so MSK etiology is also a consideration.  Patient is very well-appearing with normal vital signs.  06:44 PM  Imaging and lab work reviewed.  No evidence of PE.  Troponin negative.  Chest x-ray clear.  Patient does have regular, high risk exposure to COVID.  Given her nonspecific symptoms I do plan on testing the patient.  She will stay out of work until her negative COVID test results.  She will remain in home quarantine until that time.  Patient verbalizes understanding of this.  I provided a work note to this effect.  Patient understands  that she can check the test results in my chart.  Plan for symptom management at home along with PCP follow-up. ____________________________________________  FINAL CLINICAL IMPRESSION(S) / ED DIAGNOSES  Final diagnoses:  Precordial chest pain  SOB (shortness of breath)  Epigastric pain     MEDICATIONS GIVEN DURING THIS VISIT:  Medications  sucralfate (CARAFATE) 1 GM/10ML suspension (has no administration in time range)  sodium chloride 0.9 % bolus 500 mL (500 mLs Intravenous New Bag/Given 01/09/19 1727)  sucralfate (CARAFATE) 1 GM/10ML suspension 1 g (1 g Oral Given 01/09/19 1737)     NEW OUTPATIENT MEDICATIONS STARTED DURING THIS VISIT:  New Prescriptions   DICLOFENAC SODIUM (VOLTAREN) 1 % GEL    Apply 2 g topically 4 (four) times daily for 14 days.   FAMOTIDINE (PEPCID) 40 MG TABLET    Take 1 tablet (40 mg  total) by mouth daily for 30 days.   IBUPROFEN (ADVIL) 800 MG TABLET    Take 1 tablet (800 mg total) by mouth every 8 (eight) hours as needed for moderate pain.    Note:  This document was prepared using Dragon voice recognition software and may include unintentional dictation errors.  Alona Bene, MD Emergency Medicine    Emireth Cockerham, Arlyss Repress, MD 01/09/19 [phone]

## 2019-01-09 NOTE — ED Triage Notes (Addendum)
Pt c/o mid sternal chest pain/ abd pain  which radiates to back x 2 days

## 2019-01-11 LAB — NOVEL CORONAVIRUS, NAA (HOSP ORDER, SEND-OUT TO REF LAB; TAT 18-24 HRS): SARS-CoV-2, NAA: NOT DETECTED

## 2019-06-21 ENCOUNTER — Emergency Department (HOSPITAL_BASED_OUTPATIENT_CLINIC_OR_DEPARTMENT_OTHER)
Admission: EM | Admit: 2019-06-21 | Discharge: 2019-06-21 | Disposition: A | Discharge status: Home / Self Care | Payer: Self-pay | Attending: Emergency Medicine | Admitting: Emergency Medicine

## 2019-06-21 ENCOUNTER — Encounter (HOSPITAL_BASED_OUTPATIENT_CLINIC_OR_DEPARTMENT_OTHER): Payer: Self-pay | Admitting: *Deleted

## 2019-06-21 ENCOUNTER — Other Ambulatory Visit: Payer: Self-pay

## 2019-06-21 DIAGNOSIS — N898 Other specified noninflammatory disorders of vagina: Secondary | ICD-10-CM

## 2019-06-21 DIAGNOSIS — K0889 Other specified disorders of teeth and supporting structures: Secondary | ICD-10-CM | POA: Insufficient documentation

## 2019-06-21 DIAGNOSIS — K029 Dental caries, unspecified: Secondary | ICD-10-CM

## 2019-06-21 DIAGNOSIS — R220 Localized swelling, mass and lump, head: Secondary | ICD-10-CM | POA: Insufficient documentation

## 2019-06-21 DIAGNOSIS — Z79899 Other long term (current) drug therapy: Secondary | ICD-10-CM | POA: Insufficient documentation

## 2019-06-21 LAB — WET PREP, GENITAL
Clue Cells Wet Prep HPF POC: NONE SEEN
Sperm: NONE SEEN
Trich, Wet Prep: NONE SEEN
Yeast Wet Prep HPF POC: NONE SEEN

## 2019-06-21 LAB — URINALYSIS, ROUTINE W REFLEX MICROSCOPIC
Bilirubin Urine: NEGATIVE
Glucose, UA: NEGATIVE mg/dL
Hgb urine dipstick: NEGATIVE
Ketones, ur: NEGATIVE mg/dL
Leukocytes,Ua: NEGATIVE
Nitrite: NEGATIVE
Protein, ur: NEGATIVE mg/dL
Specific Gravity, Urine: 1.02 (ref 1.005–1.030)
pH: 5.5 (ref 5.0–8.0)

## 2019-06-21 LAB — PREGNANCY, URINE: Preg Test, Ur: NEGATIVE

## 2019-06-21 MED ORDER — NAPROXEN 500 MG PO TABS: 500.0000 mg | ORAL_TABLET | Freq: Two times a day (BID) | ORAL | 0 refills | Status: DC

## 2019-06-21 MED ORDER — PENICILLIN V POTASSIUM 500 MG PO TABS: 500.0000 mg | ORAL_TABLET | Freq: Four times a day (QID) | ORAL | 0 refills | Status: AC

## 2019-06-21 MED ORDER — HYDROCODONE-ACETAMINOPHEN 5-325 MG PO TABS: 1.0000 | ORAL_TABLET | Freq: Four times a day (QID) | ORAL | 0 refills | Status: DC | PRN

## 2019-06-21 MED ORDER — KETOROLAC TROMETHAMINE 30 MG/ML IJ SOLN
30.0000 mg | Freq: Once | INTRAMUSCULAR | Status: AC
  Administered 2019-06-21: 30 mg via INTRAMUSCULAR
  Filled 2019-06-21: qty 1

## 2019-06-21 NOTE — ED Notes (Signed)
Pt to restroom to provide urine specimen

## 2019-06-21 NOTE — ED Triage Notes (Signed)
Pt reports vaginal discharge x 1 week. Also c/o right lower dental pain and swelling since last night

## 2019-06-21 NOTE — Discharge Instructions (Addendum)
Please read instructions below. Take the antibiotic, Penicillin V, 4 times per day until they are gone. You can take the vicodin every 6 hours for severe/breakthrough pain. Do not drive or drink alcohol with his medication. You can take Naproxen up to 2 times per day with meals, as needed for moderate pain. Schedule an appointment with a dentist. Please schedule an appointment for follow up with your OBGYN or primary care.  You will receive a call from the hospital if your test results come back positive. Avoid all sexual activity until you know your test results. If your results come back positive, it is important that you inform all of your sexual partners.  Return to the ER for difficulty swallowing or breathing, fever, or new or worsening symptoms.

## 2019-06-21 NOTE — ED Provider Notes (Signed)
[facility] EMERGENCY DEPARTMENT Provider Note   CSN: [id number] Arrival date & time: 06/21/19  1320     History   Chief Complaint Chief Complaint  Patient presents with   Vaginal Discharge   Dental Pain    HPI Janice Graham is a 33 y.o. female presenting to the emergency department with complaint of a week of vaginal discharge.  She states her discharge appears different than normal.  She denies any associated vaginal discomfort, pelvic pain, urinary symptoms.  She is sexually active with one female partner without protection.  She has had BV in the past and wonders if she has recurrence.  Patient also with second complaint of right lower dental pain that began a few days ago.  She has had gradual onset of swelling to her right jaw.  She has been treating with Tylenol and ibuprofen without significant relief.  No fevers, drainage in her mouth, difficulty swallowing or breathing.     The history is provided by the patient.    Past Medical History:  Diagnosis Date   BV (bacterial vaginosis)    Goiter    UTI (lower urinary tract infection)     Patient Active Problem List   Diagnosis Date Noted   [redacted] weeks gestation of pregnancy    Abnormal findings on antenatal screening     Past Surgical History:  Procedure Laterality Date   TONSILLECTOMY     WISDOM TOOTH EXTRACTION       OB History    Gravida  4   Para  2   Term  2   Preterm      AB      Living  2     SAB      TAB      Ectopic      Multiple      Live Births               Home Medications    Prior to Admission medications   Medication Sig Start Date End Date Taking? Authorizing Provider  famotidine (PEPCID) 40 MG tablet Take 1 tablet (40 mg total) by mouth daily for 30 days. 01/09/19 02/08/19  Long, Arlyss Repress, MD  HYDROcodone-acetaminophen (NORCO/VICODIN) 5-325 MG tablet Take 1 tablet by mouth every 6 (six) hours as needed for severe pain. 06/21/19   Sheniqua Carolan, [place] N, PA-C    ibuprofen (ADVIL) 800 MG tablet Take 1 tablet (800 mg total) by mouth every 8 (eight) hours as needed for moderate pain. 01/09/19   Long, Arlyss Repress, MD  metroNIDAZOLE (METROGEL) 0.75 % vaginal gel Place 1 Applicatorful vaginally 2 (two) times daily. 09/12/18   Albrizze, Kaitlyn E, PA-C  naproxen (NAPROSYN) 500 MG tablet Take 1 tablet (500 mg total) by mouth 2 (two) times daily with a meal. 06/21/19   Jahziah Simonin, [place] N, PA-C  penicillin v potassium (VEETID) 500 MG tablet Take 1 tablet (500 mg total) by mouth 4 (four) times daily for 7 days. 06/21/19 06/28/19  Zenola Dezarn, [place] N, PA-C  Prenatal Vit-Fe Fumarate-FA (MULTIVITAMIN-PRENATAL) 27-0.8 MG TABS tablet Take 1 tablet by mouth daily at 12 noon.    [provider]    Family History No family history on file.  Social History Social History   Tobacco Use   Smoking status: Never Smoker   Smokeless tobacco: Never Used  Substance Use Topics   Alcohol use: Not Currently   Drug use: No     Allergies   Patient has no known allergies.  Review of Systems Review of Systems  All other systems reviewed and are negative.    Physical Exam Updated Vital Signs BP 119/80 (BP Location: Left Arm)    Pulse 80    Temp 98.9 F (37.2 C) (Oral)    Resp 16    Ht 5\' 2"  (1.575 m)    Wt 59.4 kg    LMP 05/31/2019 (Approximate)    SpO2 100%    Breastfeeding No    BMI 23.96 kg/m   Physical Exam Vitals signs and nursing note reviewed. Exam conducted with a chaperone present.  Constitutional:      General: She is not in acute distress.    Appearance: She is well-developed.  HENT:     Head: Normocephalic and atraumatic.     Mouth/Throat:     Comments: Poor dentition throughout mouth.  Right lower second molar with decay and tenderness.  There is some mild surrounding gingival erythema, however no fluctuance.  There is some localized swelling present to the right mandibular region.  No sublingual tenderness or swelling.  No swelling to the  submental region.  Uvula is midline, tolerating secretions, no trismus. Eyes:     Conjunctiva/sclera: Conjunctivae normal.  Cardiovascular:     Rate and Rhythm: Normal rate and regular rhythm.  Pulmonary:     Effort: Pulmonary effort is normal. No respiratory distress.     Breath sounds: Normal breath sounds.  Abdominal:     General: Bowel sounds are normal.     Palpations: Abdomen is soft.     Tenderness: There is no abdominal tenderness. There is no guarding or rebound.  Genitourinary:    Labia:        Right: No rash or tenderness.        Left: No rash or tenderness.      Cervix: No cervical motion tenderness or friability.     Uterus: Normal.      Adnexa: Right adnexa normal and left adnexa normal.     Comments: Exam performed with female RN chaperone present.  Small to moderate amount of white discharge present.  No CMT or adnexal tenderness. Skin:    General: Skin is warm.  Neurological:     Mental Status: She is alert.  Psychiatric:        Behavior: Behavior normal.      ED Treatments / Results  Labs (all labs ordered are listed, but only abnormal results are displayed) Labs Reviewed  WET PREP, GENITAL - Abnormal; Notable for the following components:      Result Value   WBC, Wet Prep HPF POC MODERATE (*)    All other components within normal limits  PREGNANCY, URINE  URINALYSIS, ROUTINE W REFLEX MICROSCOPIC  RPR  HIV ANTIBODY (ROUTINE TESTING W REFLEX)  GC/CHLAMYDIA PROBE AMP ([place]) NOT AT [facility]    EKG None  Radiology No results found.  Procedures Procedures (including critical care time)  Medications Ordered in ED Medications  ketorolac (TORADOL) 30 MG/ML injection 30 mg (30 mg Intramuscular Given 06/21/19 1453)     Initial Impression / Assessment and Plan / ED Course  I have reviewed the triage vital signs and the nursing notes.  Pertinent labs & imaging results that were available during my care of the patient were reviewed by me and  considered in my medical decision making (see chart for details).        Patient presenting with 1 week of change in vaginal discharge, is sexually active with one female  partner without protection.  No other associated symptoms.  Exam is not concerning for PID, no cervical motion tenderness or adnexal tenderness, no abdominal pain.  Wet prep with moderate white cells, however no clue cells, trichomoniasis, or yeast is present.  Wet prep sent.  UA is negative for infection.  Urine pregnancy negative.  Patient to follow-up outpatient with gynecology should symptoms persist.  Patient is aware she has STD cultures pending. Patient also with dental pain due to dental decay. No gross abscess.  VSS, afebrile, tolerating secretions. Exam unconcerning for peritonsillar abscess, Ludwig's angina or spread of infection.  Will treat with penicillin and pain medicine.  Urged patient to follow-up with dentist. Pt safe for discharge.  Discussed results, findings, treatment and follow up. Patient advised of return precautions. Patient verbalized understanding and agreed with plan.  [place] Controlled Substance reporting System queried  Final Clinical Impressions(s) / ED Diagnoses   Final diagnoses:  Pain due to dental caries  Vaginal discharge    ED Discharge Orders         Ordered    HYDROcodone-acetaminophen (NORCO/VICODIN) 5-325 MG tablet  Every 6 hours PRN     06/21/19 1545    penicillin v potassium (VEETID) 500 MG tablet  4 times daily     06/21/19 1545    naproxen (NAPROSYN) 500 MG tablet  2 times daily with meals     06/21/19 1545           Porchia Sinkler, [place] N, [place] 06/21/19 1549    Noemi Chapel, MD 06/22/19 [phone]

## 2019-06-21 NOTE — ED Notes (Signed)
Pt states vaginal discharge for past 2-3 days.  Denies foul odor, just describes as different from her usual.  Pt reports unprotected sex.

## 2019-06-22 LAB — HIV ANTIBODY (ROUTINE TESTING W REFLEX): HIV Screen 4th Generation wRfx: NONREACTIVE

## 2019-06-22 LAB — RPR: RPR Ser Ql: NONREACTIVE

## 2019-06-23 LAB — GC/CHLAMYDIA PROBE AMP (~~LOC~~) NOT AT ARMC
Chlamydia: NEGATIVE
Neisseria Gonorrhea: NEGATIVE

## 2019-09-07 ENCOUNTER — Emergency Department (HOSPITAL_BASED_OUTPATIENT_CLINIC_OR_DEPARTMENT_OTHER)
Admission: EM | Admit: 2019-09-07 | Discharge: 2019-09-07 | Disposition: A | Discharge status: Home / Self Care | Payer: Self-pay | Attending: Emergency Medicine | Admitting: Emergency Medicine

## 2019-09-07 ENCOUNTER — Other Ambulatory Visit: Payer: Self-pay

## 2019-09-07 ENCOUNTER — Encounter (HOSPITAL_BASED_OUTPATIENT_CLINIC_OR_DEPARTMENT_OTHER): Payer: Self-pay | Admitting: Emergency Medicine

## 2019-09-07 DIAGNOSIS — J029 Acute pharyngitis, unspecified: Secondary | ICD-10-CM | POA: Insufficient documentation

## 2019-09-07 LAB — GROUP A STREP BY PCR: Group A Strep by PCR: NOT DETECTED

## 2019-09-07 MED ORDER — IBUPROFEN 400 MG PO TABS
600.0000 mg | ORAL_TABLET | Freq: Once | ORAL | Status: AC
  Administered 2019-09-07: 600 mg via ORAL
  Filled 2019-09-07: qty 1

## 2019-09-07 NOTE — Discharge Instructions (Addendum)
If you have some new or worsening neck pain, high fever, vomiting/inability to swallow, trouble breathing, or any other new/worsening symptoms and return to the ER for evaluation.

## 2019-09-07 NOTE — ED Provider Notes (Signed)
[facility] EMERGENCY DEPARTMENT Provider Note   CSN: [id number] Arrival date & time: 09/07/19  4259     History Chief Complaint  Patient presents with  . Sore Throat    Janice Graham is a 34 y.o. female.  HPI 34 year old female presents with sore throat.  Started a couple days ago.  Feels like it is lower in her neck now.  No neck stiffness.  It was harder to swallow this morning and painful.  No fever, cough, shortness of breath.   Past Medical History:  Diagnosis Date  . BV (bacterial vaginosis)   . Goiter   . UTI (lower urinary tract infection)     Patient Active Problem List   Diagnosis Date Noted  . [redacted] weeks gestation of pregnancy   . Abnormal findings on antenatal screening     Past Surgical History:  Procedure Laterality Date  . TONSILLECTOMY    . WISDOM TOOTH EXTRACTION       OB History    Gravida  4   Para  2   Term  2   Preterm      AB      Living  2     SAB      TAB      Ectopic      Multiple      Live Births              History reviewed. No pertinent family history.  Social History   Tobacco Use  . Smoking status: Never Smoker  . Smokeless tobacco: Never Used  Substance Use Topics  . Alcohol use: Not Currently  . Drug use: No    Home Medications Prior to Admission medications   Not on File    Allergies    No known allergies  Review of Systems   Review of Systems  Constitutional: Negative for fever.  HENT: Positive for sore throat.   Respiratory: Negative for cough.   Gastrointestinal: Negative for vomiting.  Musculoskeletal: Negative for myalgias and neck stiffness.  All other systems reviewed and are negative.   Physical Exam Updated Vital Signs BP 110/70 (BP Location: Right Arm)   Pulse 67   Temp 98.6 F (37 C) (Oral)   Resp 18   Ht 5\' 2"  (1.575 m)   Wt 59.4 kg   LMP 08/17/2019   SpO2 100%   BMI 23.96 kg/m   Physical Exam Vitals and nursing note reviewed.  Constitutional:    General: She is not in acute distress.    Appearance: She is well-developed. She is not ill-appearing or diaphoretic.  HENT:     Head: Normocephalic and atraumatic.     Right Ear: External ear normal.     Left Ear: External ear normal.     Nose: Nose normal.     Mouth/Throat:     Pharynx: No oropharyngeal exudate or posterior oropharyngeal erythema.  Eyes:     General:        Right eye: No discharge.        Left eye: No discharge.  Cardiovascular:     Rate and Rhythm: Normal rate and regular rhythm.     Heart sounds: Normal heart sounds.  Pulmonary:     Effort: Pulmonary effort is normal.     Breath sounds: Normal breath sounds.  Abdominal:     Palpations: Abdomen is soft.     Tenderness: There is no abdominal tenderness.  Musculoskeletal:     Cervical back: Normal range  of motion and neck supple. Tenderness (mild over left anterior lymph node) present. No rigidity.  Skin:    General: Skin is warm and dry.  Neurological:     Mental Status: She is alert.  Psychiatric:        Mood and Affect: Mood is not anxious.     ED Results / Procedures / Treatments   Labs (all labs ordered are listed, but only abnormal results are displayed) Labs Reviewed  GROUP A STREP BY PCR    EKG None  Radiology No results found.  Procedures Procedures (including critical care time)  Medications Ordered in ED Medications  ibuprofen (ADVIL) tablet 600 mg (600 mg Oral Given 09/07/19 1028)    ED Course  I have reviewed the triage vital signs and the nursing notes.  Pertinent labs & imaging results that were available during my care of the patient were reviewed by me and considered in my medical decision making (see chart for details).    MDM Rules/Calculators/A&P                      Strep test is negative.  Exam is unremarkable including no obvious abscess or swelling.  She reports difficulty swallowing though she is able to swallow.  My suspicion for deep space neck infection is very  low.  Likely a viral pharyngitis.  I offered Covid testing though with isolated sore throat this is also less likely.  She declines which is reasonable.  NSAIDs, Tylenol, and we discussed return precautions. Final Clinical Impression(s) / ED Diagnoses Final diagnoses:  Viral pharyngitis    Rx / DC Orders ED Discharge Orders    None       Sherwood Gambler, MD 09/07/19 1112

## 2019-09-07 NOTE — ED Triage Notes (Signed)
Sore throat and difficulty swallowing for a few days.  Pt concerned that her throat feels bigger.  No fever, no cough, no other uri symptoms.

## 2020-03-07 ENCOUNTER — Emergency Department (HOSPITAL_BASED_OUTPATIENT_CLINIC_OR_DEPARTMENT_OTHER): Payer: Self-pay

## 2020-03-07 ENCOUNTER — Emergency Department (HOSPITAL_BASED_OUTPATIENT_CLINIC_OR_DEPARTMENT_OTHER)
Admission: EM | Admit: 2020-03-07 | Discharge: 2020-03-07 | Disposition: A | Discharge status: Home / Self Care | Payer: Self-pay | Attending: Emergency Medicine | Admitting: Emergency Medicine

## 2020-03-07 ENCOUNTER — Encounter (HOSPITAL_BASED_OUTPATIENT_CLINIC_OR_DEPARTMENT_OTHER): Payer: Self-pay | Admitting: Emergency Medicine

## 2020-03-07 ENCOUNTER — Other Ambulatory Visit: Payer: Self-pay

## 2020-03-07 DIAGNOSIS — N12 Tubulo-interstitial nephritis, not specified as acute or chronic: Secondary | ICD-10-CM | POA: Insufficient documentation

## 2020-03-07 DIAGNOSIS — Z20822 Contact with and (suspected) exposure to covid-19: Secondary | ICD-10-CM | POA: Insufficient documentation

## 2020-03-07 DIAGNOSIS — R109 Unspecified abdominal pain: Secondary | ICD-10-CM | POA: Insufficient documentation

## 2020-03-07 LAB — COMPREHENSIVE METABOLIC PANEL
ALT: 22 U/L (ref 0–44)
AST: 29 U/L (ref 15–41)
Albumin: 4.1 g/dL (ref 3.5–5.0)
Alkaline Phosphatase: 47 U/L (ref 38–126)
Anion gap: 11 (ref 5–15)
BUN: 11 mg/dL (ref 6–20)
CO2: 25 mmol/L (ref 22–32)
Calcium: 9 mg/dL (ref 8.9–10.3)
Chloride: 103 mmol/L (ref 98–111)
Creatinine, Ser: 0.74 mg/dL (ref 0.44–1.00)
GFR calc Af Amer: >60 mL/min (ref >60)
GFR calc non Af Amer: >60 mL/min (ref >60)
Glucose, Bld: 101 mg/dL — ABNORMAL HIGH (ref 70–99)
Potassium: 3.7 mmol/L (ref 3.5–5.1)
Sodium: 139 mmol/L (ref 135–145)
Total Bilirubin: 0.7 mg/dL (ref 0.3–1.2)
Total Protein: 7.7 g/dL (ref 6.5–8.1)

## 2020-03-07 LAB — SARS CORONAVIRUS 2 BY RT PCR (HOSPITAL ORDER, PERFORMED IN ~~LOC~~ HOSPITAL LAB): SARS Coronavirus 2: NEGATIVE

## 2020-03-07 LAB — CBC WITH DIFFERENTIAL/PLATELET
Abs Immature Granulocytes: 0.04 10*3/uL (ref 0.00–0.07)
Basophils Absolute: 0 10*3/uL (ref 0.0–0.1)
Basophils Relative: 0 %
Eosinophils Absolute: 0 10*3/uL (ref 0.0–0.5)
Eosinophils Relative: 0 %
HCT: 31.8 % — ABNORMAL LOW (ref 36.0–46.0)
Hemoglobin: 10.8 g/dL — ABNORMAL LOW (ref 12.0–15.0)
Immature Granulocytes: 0 %
Lymphocytes Relative: 5 %
Lymphs Abs: 0.5 10*3/uL — ABNORMAL LOW (ref 0.7–4.0)
MCH: 28.2 pg (ref 26.0–34.0)
MCHC: 34 g/dL (ref 30.0–36.0)
MCV: 83 fL (ref 80.0–100.0)
Monocytes Absolute: 0.6 10*3/uL (ref 0.1–1.0)
Monocytes Relative: 6 %
Neutro Abs: 8.3 10*3/uL — ABNORMAL HIGH (ref 1.7–7.7)
Neutrophils Relative %: 89 %
Platelets: 221 10*3/uL (ref 150–400)
RBC: 3.83 MIL/uL — ABNORMAL LOW (ref 3.87–5.11)
RDW: 13.2 % (ref 11.5–15.5)
WBC: 9.4 10*3/uL (ref 4.0–10.5)
nRBC: 0 % (ref 0.0–0.2)

## 2020-03-07 LAB — URINALYSIS, ROUTINE W REFLEX MICROSCOPIC
Bilirubin Urine: NEGATIVE
Glucose, UA: NEGATIVE mg/dL
Ketones, ur: NEGATIVE mg/dL
Nitrite: POSITIVE — AB
Protein, ur: 30 mg/dL — AB
Specific Gravity, Urine: 1.02 (ref 1.005–1.030)
pH: 6 (ref 5.0–8.0)

## 2020-03-07 LAB — PREGNANCY, URINE: Preg Test, Ur: NEGATIVE

## 2020-03-07 LAB — URINALYSIS, MICROSCOPIC (REFLEX): WBC, UA: >50 WBC/hpf (ref 0–5)

## 2020-03-07 LAB — LIPASE, BLOOD: Lipase: 22 U/L (ref 11–51)

## 2020-03-07 MED ORDER — ONDANSETRON 4 MG PO TBDP: ORAL_TABLET | ORAL | 0 refills | Status: DC

## 2020-03-07 MED ORDER — SODIUM CHLORIDE 0.9 % IV SOLN
1.0000 g | Freq: Once | INTRAVENOUS | Status: AC
  Administered 2020-03-07: 1 g via INTRAVENOUS
  Filled 2020-03-07: qty 10

## 2020-03-07 MED ORDER — ACETAMINOPHEN 500 MG PO TABS
1000.0000 mg | ORAL_TABLET | Freq: Once | ORAL | Status: AC
  Administered 2020-03-07: 1000 mg via ORAL
  Filled 2020-03-07: qty 2

## 2020-03-07 MED ORDER — KETOROLAC TROMETHAMINE 30 MG/ML IJ SOLN
30.0000 mg | Freq: Once | INTRAMUSCULAR | Status: AC
  Administered 2020-03-07: 30 mg via INTRAVENOUS
  Filled 2020-03-07: qty 1

## 2020-03-07 MED ORDER — ONDANSETRON HCL 4 MG/2ML IJ SOLN
4.0000 mg | Freq: Once | INTRAMUSCULAR | Status: AC
  Administered 2020-03-07: 4 mg via INTRAVENOUS
  Filled 2020-03-07: qty 2

## 2020-03-07 MED ORDER — HYDROCODONE-ACETAMINOPHEN 5-325 MG PO TABS: 1.0000 | ORAL_TABLET | Freq: Four times a day (QID) | ORAL | 0 refills | Status: DC | PRN

## 2020-03-07 MED ORDER — SODIUM CHLORIDE 0.9 % IV BOLUS
1000.0000 mL | Freq: Once | INTRAVENOUS | Status: AC
  Administered 2020-03-07: 1000 mL via INTRAVENOUS

## 2020-03-07 MED ORDER — MORPHINE SULFATE (PF) 4 MG/ML IV SOLN
4.0000 mg | Freq: Once | INTRAVENOUS | Status: AC
  Administered 2020-03-07: 4 mg via INTRAVENOUS
  Filled 2020-03-07: qty 1

## 2020-03-07 MED ORDER — CEPHALEXIN 500 MG PO CAPS: 500.0000 mg | ORAL_CAPSULE | Freq: Four times a day (QID) | ORAL | 0 refills | Status: DC

## 2020-03-07 MED ORDER — IBUPROFEN 600 MG PO TABS: 600.0000 mg | ORAL_TABLET | Freq: Four times a day (QID) | ORAL | 0 refills | Status: DC | PRN

## 2020-03-07 NOTE — Discharge Instructions (Signed)
You have pyelonephritis, a kidney infection, you will need to take antibiotics for the next 10 days, it is extremely important that you take entire course of antibiotics even if your symptoms resolve.  Use ibuprofen and Tylenol scheduled every 6 hours for fever and pain.  For breakthrough pain you may use Norco, this can cause some drowsiness.  Please make sure you are drinking plenty of fluids.  You can use Zofran as needed for nausea and vomiting.  If you have significantly worsening pain, fevers not well managed with ibuprofen and Tylenol, or have worsening vomiting and are unable to keep down your medications you should return to the ED.

## 2020-03-07 NOTE — ED Provider Notes (Signed)
[facility] EMERGENCY DEPARTMENT Provider Note   CSN: [id number] Arrival date & time: 03/07/20  1962     History Chief Complaint  Patient presents with   Back Pain   Dysuria    Janice Graham is a 34 y.o. female.  Janice Graham is a 34 y.o. female with a history of UTI, BV, who presents to the emergency department for evaluation of right-sided flank pain, dysuria, fever, nausea and vomiting.  She reports she has had dysuria for the past 2 to 3 days, but woke up this morning with fever, body aches and persistent nausea and vomiting.  Actively vomiting on arrival to the department.  She has not taken anything for her fever this morning because of vomiting.  Reports that this feels similar to previous bladder infection she has had, but symptoms are much more severe.  She reports burning and pain with urination and odor noted to urine, and flank pain that became much more severe this morning.  She is not sure if she seen any blood in her urine.  Denies any vaginal bleeding or discharge.  No associated chest pain, shortness of breath or cough.  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        Past Medical History:  Diagnosis Date   BV (bacterial vaginosis)    Goiter    UTI (lower urinary tract infection)     Patient Active Problem List   Diagnosis Date Noted   [redacted] weeks gestation of pregnancy    Abnormal findings on antenatal screening     Past Surgical History:  Procedure Laterality Date   TONSILLECTOMY     WISDOM TOOTH EXTRACTION       OB History    Gravida  4   Para  2   Term  2   Preterm      AB      Living  2     SAB      TAB      Ectopic      Multiple      Live Births              History reviewed. No pertinent family history.  Social History   Tobacco Use   Smoking status: Never Smoker   Smokeless tobacco: Never Used  Vaping Use   Vaping Use: Never  used  Substance Use Topics   Alcohol use: Not Currently   Drug use: No    Home Medications Prior to Admission medications   Medication Sig Start Date End Date Taking? Authorizing Provider  cephALEXin (KEFLEX) 500 MG capsule Take 1 capsule (500 mg total) by mouth 4 (four) times daily. 03/07/20   Dartha Lodge, PA-C  HYDROcodone-acetaminophen (NORCO/VICODIN) 5-325 MG tablet Take 1 tablet by mouth every 6 (six) hours as needed. 03/07/20   Dartha Lodge, PA-C  ibuprofen (ADVIL) 600 MG tablet Take 1 tablet (600 mg total) by mouth every 6 (six) hours as needed. 03/07/20   Dartha Lodge, PA-C  ondansetron (ZOFRAN ODT) 4 MG disintegrating tablet 4mg  ODT q4 hours prn nausea/vomit 03/07/20   05/07/20, PA-C    Allergies    No known allergies  Review of Systems   Review of Systems  Constitutional: Positive for chills and fever.  HENT: Negative.   Respiratory: Negative for cough and shortness of breath.   Cardiovascular: Negative for chest pain.  Gastrointestinal: Positive  for abdominal pain, nausea and vomiting. Negative for constipation and diarrhea.  Genitourinary: Positive for dysuria, flank pain and frequency. Negative for hematuria, vaginal bleeding and vaginal discharge.  All other systems reviewed and are negative.   Physical Exam Updated Vital Signs BP 112/76 (BP Location: Right Arm)    Pulse (!) 133    Temp (!) 103.3 F (39.6 C) (Oral)    Resp 18    Ht 5\' 2"  (1.575 m)    Wt 61.2 kg    SpO2 97%    BMI 24.69 kg/m   Physical Exam Vitals and nursing note reviewed.  Constitutional:      General: She is not in acute distress.    Appearance: Normal appearance. She is well-developed. She is ill-appearing. She is not diaphoretic.     Comments: Mildly ill-appearing but in no acute distress  HENT:     Head: Normocephalic and atraumatic.     Mouth/Throat:     Mouth: Mucous membranes are moist.     Pharynx: Oropharynx is clear.  Eyes:     General:        Right eye: No discharge.         Left eye: No discharge.     Pupils: Pupils are equal, round, and reactive to light.  Cardiovascular:     Rate and Rhythm: Normal rate and regular rhythm.     Pulses: Normal pulses.     Heart sounds: Normal heart sounds. No murmur heard.  No friction rub. No gallop.   Pulmonary:     Effort: Pulmonary effort is normal. No respiratory distress.     Breath sounds: Normal breath sounds. No wheezing or rales.     Comments: Respirations equal and unlabored, patient able to speak in full sentences, lungs clear to auscultation bilaterally Abdominal:     General: Bowel sounds are normal. There is no distension.     Palpations: Abdomen is soft. There is no mass.     Tenderness: There is abdominal tenderness. There is right CVA tenderness. There is no guarding.     Comments: Abdomen is soft and nondistended, bowel sounds present throughout, there is tenderness over the right side of the abdomen and significant right-sided CVA tenderness, no peritoneal signs or guarding  Musculoskeletal:        General: No deformity.     Cervical back: Neck supple.  Skin:    General: Skin is warm and dry.     Capillary Refill: Capillary refill takes less than 2 seconds.  Neurological:     Mental Status: She is alert and oriented to person, place, and time.     Coordination: Coordination normal.     Comments: Speech is clear, able to follow commands Moves extremities without ataxia, coordination intact  Psychiatric:        Mood and Affect: Mood normal.        Behavior: Behavior normal.     ED Results / Procedures / Treatments   Labs (all labs ordered are listed, but only abnormal results are displayed) Labs Reviewed  COMPREHENSIVE METABOLIC PANEL - Abnormal; Notable for the following components:      Result Value   Glucose, Bld 101 (*)    All other components within normal limits  CBC WITH DIFFERENTIAL/PLATELET - Abnormal; Notable for the following components:   RBC 3.83 (*)    Hemoglobin 10.8 (*)      HCT 31.8 (*)    Neutro Abs 8.3 (*)    Lymphs Abs 0.5 (*)  All other components within normal limits  URINALYSIS, ROUTINE W REFLEX MICROSCOPIC - Abnormal; Notable for the following components:   APPearance TURBID (*)    Hgb urine dipstick MODERATE (*)    Protein, ur 30 (*)    Nitrite POSITIVE (*)    Leukocytes,Ua SMALL (*)    All other components within normal limits  URINALYSIS, MICROSCOPIC (REFLEX) - Abnormal; Notable for the following components:   Bacteria, UA MANY (*)    All other components within normal limits  SARS CORONAVIRUS 2 BY RT PCR (HOSPITAL ORDER, PERFORMED IN [facility])  URINE CULTURE  PREGNANCY, URINE  LIPASE, BLOOD    EKG None  Radiology CT Renal Stone Study  Result Date: 03/07/2020 CLINICAL DATA:  Right flank pain, fever EXAM: CT ABDOMEN AND PELVIS WITHOUT CONTRAST TECHNIQUE: Multidetector CT imaging of the abdomen and pelvis was performed following the standard protocol without IV contrast. COMPARISON:  2015 FINDINGS: Lower chest: No acute abnormality. Hepatobiliary: No focal liver abnormality is seen. No gallstones, gallbladder wall thickening, or biliary dilatation. Pancreas: Unremarkable. Spleen: Unremarkable. Adrenals/Urinary Tract: Adrenals are unremarkable. Small cyst of the lower pole of the right kidney is again noted. There are no renal calculi or hydronephrosis. Bladder is unremarkable. Stomach/Bowel: Stomach is within normal limits. Bowel is normal in caliber. Vascular/Lymphatic: No significant vascular findings. No enlarged lymph nodes identified. Reproductive: Uterus and bilateral adnexa are unremarkable. Other: No ascites.  Abdominal wall is unremarkable. Musculoskeletal: No acute osseous abnormality. IMPRESSION: No acute abnormality. No urinary tract calculi. Pyelonephritis cannot be excluded on this noncontrast study. Electronically Signed   By: Guadlupe SpanishPraneil  Patel M.D.   On: 03/07/2020 11:49    Procedures Procedures (including critical  care time)  Medications Ordered in ED Medications  sodium chloride 0.9 % bolus 1,000 mL (0 mLs Intravenous Stopped 03/07/20 1327)  ondansetron (ZOFRAN) injection 4 mg (4 mg Intravenous Given 03/07/20 1204)  acetaminophen (TYLENOL) tablet 1,000 mg (1,000 mg Oral Given 03/07/20 1159)  morphine 4 MG/ML injection 4 mg (4 mg Intravenous Given 03/07/20 1204)  cefTRIAXone (ROCEPHIN) 1 g in sodium chloride 0.9 % 100 mL IVPB (0 g Intravenous Stopped 03/07/20 1400)  ketorolac (TORADOL) 30 MG/ML injection 30 mg (30 mg Intravenous Given 03/07/20 1400)    ED Course  I have reviewed the triage vital signs and the nursing notes.  Pertinent labs & imaging results that were available during my care of the patient were reviewed by me and considered in my medical decision making (see chart for details).   MDM Rules/Calculators/A&P                          34 year old female presents with 2 to 3 days of dysuria, today developing severe right flank pain, vomiting, fevers and chills.  On arrival she is somewhat ill-appearing but in no acute distress.  Febrile to 103.3 with associated tachycardia, heart rate of 133, but all other vitals are normal, feel tachycardia is related to fever, patient does not have other sepsis criteria.  High clinical suspicion for UTI or pyelonephritis.  On exam she has focal right CVA tenderness.  But given pain on exam that is unilateral will get CT renal stone study to rule out infected stone or other acute intra-abdominal pathology.  Will give IV fluids, Tylenol, pain and nausea medication.  Patient is also unvaccinated and has had Covid exposures at work, will get Covid test  I have independently ordered, reviewed and interpreted all labs  and imaging: CBC: No leukocytosis, stable hemoglobin CMP: Glucose 101, no other electrolyte derangements, normal renal and liver function Lipase: WNL UA: Consistent with infection with positive nitrites, leukocytes present, many bacteria, WBCs and RBCs  present.  Culture pending Pregnancy: Negative Covid: Negative  CT: No acute intra-abdominal abnormality, no obstructing ureteral stones noted.  On reevaluation patient is feeling much better, her fever has resolved and tachycardia has improved.  Her vitals have remained stable.  She reports significant improvement in her pain and nausea and is now tolerating p.o. food and fluids.  Patient given dose of IV Rocephin for pyelonephritis, will discharge home on Keflex and prescribed Motrin and Norco, as well as Zofran.  Strict return precautions discussed with patient, she expresses understanding and agreement with plan.  I also spent time counseling patient on Covid vaccine and answered all questions.  Final Clinical Impression(s) / ED Diagnoses Final diagnoses:  Pyelonephritis    Rx / DC Orders ED Discharge Orders         Ordered    cephALEXin (KEFLEX) 500 MG capsule  4 times daily     Discontinue  Reprint     03/07/20 1417    ondansetron (ZOFRAN ODT) 4 MG disintegrating tablet     Discontinue  Reprint     03/07/20 1417    ibuprofen (ADVIL) 600 MG tablet  Every 6 hours PRN     Discontinue  Reprint     03/07/20 1417    HYDROcodone-acetaminophen (NORCO/VICODIN) 5-325 MG tablet  Every 6 hours PRN     Discontinue  Reprint     03/07/20 1417           Dartha Lodge, PA-C 03/07/20 1430    Alvira Monday, MD 03/08/20 [phone]

## 2020-03-07 NOTE — ED Triage Notes (Signed)
Pt having back and side pain, dysuria, fever and N/V since yesterday.  High fever. Pt is actively vomiting.

## 2020-03-09 LAB — URINE CULTURE: Culture: >=100000 — AB

## 2020-03-10 ENCOUNTER — Telehealth: Payer: Self-pay

## 2020-03-10 NOTE — Telephone Encounter (Signed)
Post ED Visit - Positive Culture Follow-up  Culture report reviewed by antimicrobial stewardship pharmacist: [facility] []  , Pharm.D. []  Enzo Bi, Pharm.D., BCPS AQ-ID []  , Pharm.D., BCPS []  Celedonio Miyamoto, .D., BCPS []  [place], .D., BCPS, AAHIVP []  Georgina Pillion, Pharm.D., BCPS, AAHIVP []  [address], PharmD, BCPS []  , PharmD, BCPS []  [place], PharmD, BCPS []  [address], PharmD []  , PharmD, BCPS []  Estella Husk, PharmD [facility] []  Lysle Pearl, PharmD []  , PharmD []  Phillips Climes, PharmD []  , Rph []  Agapito Games) , PharmD []  Verlan Friends, PharmD []  , PharmD []  Mervyn Gay, PharmD []  , PharmD []  Vinnie Level, PharmD []  Royetta Car, PharmD []  , PharmD []  Len Childs, PharmD   Positive urine culture Treated with Cephalexin, organism sensitive to the same and no further patient follow-up is required at this time.  03/10/2020, 8:33 AM

## 2020-07-08 ENCOUNTER — Encounter (HOSPITAL_BASED_OUTPATIENT_CLINIC_OR_DEPARTMENT_OTHER): Payer: Self-pay

## 2020-07-08 ENCOUNTER — Emergency Department (HOSPITAL_BASED_OUTPATIENT_CLINIC_OR_DEPARTMENT_OTHER): Payer: Self-pay

## 2020-07-08 ENCOUNTER — Other Ambulatory Visit: Payer: Self-pay

## 2020-07-08 ENCOUNTER — Emergency Department (HOSPITAL_BASED_OUTPATIENT_CLINIC_OR_DEPARTMENT_OTHER)
Admission: EM | Admit: 2020-07-08 | Discharge: 2020-07-08 | Disposition: A | Discharge status: Home / Self Care | Payer: Self-pay | Attending: Emergency Medicine | Admitting: Emergency Medicine

## 2020-07-08 DIAGNOSIS — R072 Precordial pain: Secondary | ICD-10-CM | POA: Insufficient documentation

## 2020-07-08 DIAGNOSIS — R1011 Right upper quadrant pain: Secondary | ICD-10-CM | POA: Insufficient documentation

## 2020-07-08 DIAGNOSIS — R1013 Epigastric pain: Secondary | ICD-10-CM | POA: Insufficient documentation

## 2020-07-08 LAB — CBC
HCT: 34 % — ABNORMAL LOW (ref 36.0–46.0)
Hemoglobin: 11.5 g/dL — ABNORMAL LOW (ref 12.0–15.0)
MCH: 28.1 pg (ref 26.0–34.0)
MCHC: 33.8 g/dL (ref 30.0–36.0)
MCV: 83.1 fL (ref 80.0–100.0)
Platelets: 281 10*3/uL (ref 150–400)
RBC: 4.09 MIL/uL (ref 3.87–5.11)
RDW: 13.7 % (ref 11.5–15.5)
WBC: 3.6 10*3/uL — ABNORMAL LOW (ref 4.0–10.5)
nRBC: 0 % (ref 0.0–0.2)

## 2020-07-08 LAB — HEPATIC FUNCTION PANEL
ALT: 11 U/L (ref 0–44)
AST: 17 U/L (ref 15–41)
Albumin: 4.5 g/dL (ref 3.5–5.0)
Alkaline Phosphatase: 35 U/L — ABNORMAL LOW (ref 38–126)
Bilirubin, Direct: 0.1 mg/dL (ref 0.0–0.2)
Indirect Bilirubin: 0.5 mg/dL (ref 0.3–0.9)
Total Bilirubin: 0.6 mg/dL (ref 0.3–1.2)
Total Protein: 7.5 g/dL (ref 6.5–8.1)

## 2020-07-08 LAB — BASIC METABOLIC PANEL
Anion gap: 8 (ref 5–15)
BUN: 14 mg/dL (ref 6–20)
CO2: 28 mmol/L (ref 22–32)
Calcium: 9.3 mg/dL (ref 8.9–10.3)
Chloride: 101 mmol/L (ref 98–111)
Creatinine, Ser: 0.59 mg/dL (ref 0.44–1.00)
GFR, Estimated: >60 mL/min (ref >60)
Glucose, Bld: 87 mg/dL (ref 70–99)
Potassium: 3.4 mmol/L — ABNORMAL LOW (ref 3.5–5.1)
Sodium: 137 mmol/L (ref 135–145)

## 2020-07-08 LAB — PREGNANCY, URINE: Preg Test, Ur: NEGATIVE

## 2020-07-08 LAB — TROPONIN I (HIGH SENSITIVITY)
Troponin I (High Sensitivity): <2 ng/L (ref <18)
Troponin I (High Sensitivity): <2 ng/L (ref <18)

## 2020-07-08 LAB — LIPASE, BLOOD: Lipase: 21 U/L (ref 11–51)

## 2020-07-08 MED ORDER — PANTOPRAZOLE SODIUM 40 MG PO TBEC: 40.0000 mg | DELAYED_RELEASE_TABLET | Freq: Every day | ORAL | 0 refills | Status: AC

## 2020-07-08 MED ORDER — ALUM & MAG HYDROXIDE-SIMETH 400-400-40 MG/5ML PO SUSP: 10.0000 mL | Freq: Four times a day (QID) | ORAL | 0 refills | Status: AC | PRN

## 2020-07-08 NOTE — ED Provider Notes (Signed)
Emergency Department Provider Note   I have reviewed the triage vital signs and the nursing notes.   HISTORY  Chief Complaint Chest Pain   HPI Janice Graham is a 34 y.o. female with PMH reviewed presents to the ED with 2 days of central CP. Patient describes the pain as "gnawing" and "sometimes sharp." The pain radiates to the back and is worse with eating/drinking. No SOB or pleuritic CP. No fever or chills. Some epigastric abdominal pain noted as well. No vomiting or diarrhea. No UTI symptoms. No sick contacts.   Past Medical History:  Diagnosis Date  . BV (bacterial vaginosis)   . Goiter   . UTI (lower urinary tract infection)     Patient Active Problem List   Diagnosis Date Noted  . [redacted] weeks gestation of pregnancy   . Abnormal findings on antenatal screening     Past Surgical History:  Procedure Laterality Date  . TONSILLECTOMY    . WISDOM TOOTH EXTRACTION      Allergies Patient has no known allergies.  No family history on file.  Social History Social History   Tobacco Use  . Smoking status: Never Smoker  . Smokeless tobacco: Never Used  Vaping Use  . Vaping Use: Never used  Substance Use Topics  . Alcohol use: Not Currently  . Drug use: No    Review of Systems  Constitutional: No fever/chills Eyes: No visual changes. ENT: No sore throat. Cardiovascular: Positive chest pain. Respiratory: Denies shortness of breath. Gastrointestinal: Positive epigastric abdominal pain.  No nausea, no vomiting.  No diarrhea.  No constipation. Genitourinary: Negative for dysuria. Musculoskeletal: Negative for back pain. Skin: Negative for rash. Neurological: Negative for headaches, focal weakness or numbness.  10-point ROS otherwise negative.  ____________________________________________   PHYSICAL EXAM:  VITAL SIGNS: ED Triage Vitals  Enc Vitals Group     BP 07/08/20 1152 114/85     Pulse Rate 07/08/20 1152 63     Resp 07/08/20 1152 18     Temp  07/08/20 1152 98.4 F (36.9 C)     Temp Source 07/08/20 1152 Oral     SpO2 07/08/20 1152 100 %     Weight 07/08/20 1215 124 lb (56.2 kg)     Height 07/08/20 1215 5\' 2"  (1.575 m)   Constitutional: Alert and oriented. Well appearing and in no acute distress. Eyes: Conjunctivae are normal. Head: Atraumatic. Nose: No congestion/rhinnorhea. Mouth/Throat: Mucous membranes are moist.  Neck: No stridor.   Cardiovascular: Normal rate, regular rhythm. Good peripheral circulation. Grossly normal heart sounds.   Respiratory: Normal respiratory effort.  No retractions. Lungs CTAB. Gastrointestinal: Soft with mid-epigastric tenderness. Mild RUQ tenderness but negative Murphy's sign. No distention.  Musculoskeletal:  No gross deformities of extremities. Neurologic:  Normal speech and language.  Skin:  Skin is warm, dry and intact. No rash noted.  ____________________________________________   LABS (all labs ordered are listed, but only abnormal results are displayed)  Labs Reviewed  BASIC METABOLIC PANEL - Abnormal; Notable for the following components:      Result Value   Potassium 3.4 (*)    All other components within normal limits  CBC - Abnormal; Notable for the following components:   WBC 3.6 (*)    Hemoglobin 11.5 (*)    HCT 34.0 (*)    All other components within normal limits  HEPATIC FUNCTION PANEL - Abnormal; Notable for the following components:   Alkaline Phosphatase 35 (*)    All other components within normal  limits  PREGNANCY, URINE  LIPASE, BLOOD  TROPONIN I (HIGH SENSITIVITY)  TROPONIN I (HIGH SENSITIVITY)   ____________________________________________  EKG   EKG Interpretation  Date/Time:  Friday July 08 2020 11:47:26 EST Ventricular Rate:  72 PR Interval:  130 QRS Duration: 86 QT Interval:  392 QTC Calculation: 429 R Axis:   78 Text Interpretation: Normal sinus rhythm Normal ECG No STEMI Confirmed by Alona Bene [phone]) on 07/08/2020 3:05:21 PM        ____________________________________________  RADIOLOGY  DG Chest 2 View  Result Date: 07/08/2020 CLINICAL DATA:  Chest pain EXAM: CHEST - 2 VIEW COMPARISON:  None. FINDINGS: The heart size and mediastinal contours are within normal limits. Both lungs are clear. No pleural effusion or pneumothorax. The visualized skeletal structures are unremarkable. IMPRESSION: No acute process in the chest. Electronically Signed   By: Guadlupe Spanish M.D.   On: 07/08/2020 12:32   US Abdomen Limited RUQ (LIVER/GB)  Result Date: 07/08/2020 CLINICAL DATA:  Right upper quadrant pain EXAM: ULTRASOUND ABDOMEN LIMITED RIGHT UPPER QUADRANT COMPARISON:  03/07/2020 FINDINGS: Gallbladder: No gallstones or wall thickening visualized. No sonographic Murphy sign noted by sonographer. Common bile duct: Diameter: 1 mm Liver: No focal lesion identified. Within normal limits in parenchymal echogenicity. Portal vein is patent on color Doppler imaging with normal direction of blood flow towards the liver. Other: None. IMPRESSION: 1. Unremarkable right upper quadrant ultrasound. Electronically Signed   By: Sharlet Salina M.D.   On: 07/08/2020 16:18    ____________________________________________   PROCEDURES  Procedure(s) performed:   Procedures  None ____________________________________________   INITIAL IMPRESSION / ASSESSMENT AND PLAN / ED COURSE  Pertinent labs & imaging results that were available during my care of the patient were reviewed by me and considered in my medical decision making (see chart for details).   Patient presents to the ED with CP and epigastric abdominal pain. Patient is low risk for PE by [place] and PERC negative. ACS much less likely. EKG reviewed. No labs are reassuring. Triage ordered BMP. With epigastric pain will add LFTs, lipase, and obtain RUQ [place].   RUQ [place] reviewed and negative. Labs reviewed. No acute findings. Plan for supportive care and close PCP follow up.   I reviewed all  nursing notes, vitals, pertinent old records, EKGs, labs, imaging (as available).  ____________________________________________  FINAL CLINICAL IMPRESSION(S) / ED DIAGNOSES  Final diagnoses:  RUQ abdominal pain  Precordial chest pain     NEW OUTPATIENT MEDICATIONS STARTED DURING THIS VISIT:  Discharge Medication List as of 07/08/2020  5:38 PM    START taking these medications   Details  alum & mag hydroxide-simeth (MAALOX MAX) 400-400-40 MG/5ML suspension Take 10 mLs by mouth every 6 (six) hours as needed for indigestion., Starting Fri 07/08/2020, Print    pantoprazole (PROTONIX) 40 MG tablet Take 1 tablet (40 mg total) by mouth daily., Starting Fri 07/08/2020, Print        Note:  This document was prepared using Dragon voice recognition software and may include unintentional dictation errors.  Alona Bene, MD, [facility]    Makel Mcmann, Arlyss Repress, MD 07/09/20 1041

## 2020-07-08 NOTE — ED Triage Notes (Signed)
Pt c/o day 2 CP-NAD-steady gait

## 2020-07-08 NOTE — Discharge Instructions (Signed)

## 2022-03-03 ENCOUNTER — Emergency Department (HOSPITAL_BASED_OUTPATIENT_CLINIC_OR_DEPARTMENT_OTHER)
Admission: EM | Admit: 2022-03-03 | Discharge: 2022-03-03 | Disposition: A | Discharge status: Home / Self Care | Payer: Self-pay | Attending: Emergency Medicine | Admitting: Emergency Medicine

## 2022-03-03 ENCOUNTER — Encounter (HOSPITAL_BASED_OUTPATIENT_CLINIC_OR_DEPARTMENT_OTHER): Payer: Self-pay | Admitting: Emergency Medicine

## 2022-03-03 ENCOUNTER — Other Ambulatory Visit: Payer: Self-pay

## 2022-03-03 DIAGNOSIS — N3289 Other specified disorders of bladder: Secondary | ICD-10-CM | POA: Insufficient documentation

## 2022-03-03 LAB — URINALYSIS, ROUTINE W REFLEX MICROSCOPIC
Bilirubin Urine: NEGATIVE
Glucose, UA: NEGATIVE mg/dL
Hgb urine dipstick: NEGATIVE
Ketones, ur: NEGATIVE mg/dL
Leukocytes,Ua: NEGATIVE
Nitrite: NEGATIVE
Protein, ur: NEGATIVE mg/dL
Specific Gravity, Urine: 1.025 (ref 1.005–1.030)
pH: 5.5 (ref 5.0–8.0)

## 2022-03-03 LAB — PREGNANCY, URINE: Preg Test, Ur: NEGATIVE

## 2022-03-03 MED ORDER — CEFTRIAXONE SODIUM 500 MG IJ SOLR
500.0000 mg | Freq: Once | INTRAMUSCULAR | Status: AC
  Administered 2022-03-03: 500 mg via INTRAMUSCULAR
  Filled 2022-03-03: qty 500

## 2022-03-03 MED ORDER — ONDANSETRON 4 MG PO TBDP
8.0000 mg | ORAL_TABLET | Freq: Once | ORAL | Status: AC
  Administered 2022-03-03: 8 mg via ORAL
  Filled 2022-03-03: qty 2

## 2022-03-03 MED ORDER — DOXYCYCLINE HYCLATE 100 MG PO CAPS: 100.0000 mg | ORAL_CAPSULE | Freq: Two times a day (BID) | ORAL | 0 refills | Status: AC

## 2022-03-03 MED ORDER — LIDOCAINE HCL (PF) 1 % IJ SOLN
INTRAMUSCULAR | Status: AC
  Administered 2022-03-03: 2.1 mL
  Filled 2022-03-03: qty 5

## 2022-03-03 MED ORDER — ONDANSETRON HCL 4 MG PO TABS: 4.0000 mg | ORAL_TABLET | Freq: Three times a day (TID) | ORAL | 0 refills | Status: AC | PRN

## 2022-03-03 NOTE — ED Provider Notes (Signed)
[facility] EMERGENCY DEPARTMENT Provider Note   CSN: [id number] Arrival date & time: 03/03/22  1552     History  Chief Complaint  Patient presents with   Dysuria    Janice Graham is a 36 y.o. female.  Patient presents to the hospital complaining of urinary frequency, bladder spasms, "right side inflammation", vaginal discharge.  Patient states she has extensive history of frequent UTIs and feels like this is a new infection.  States in the past she has required hospitalization due to extensive infection.  She is denying nausea, vomiting, diarrhea.  Endorses right side pain that she considers to be inflammation, dysuria, bladder spasms, and also complains of minor vaginal discharge.  Patient states that she does have some concern that she could have an STI.  Past medical history significant for UTI, BV  HPI     Home Medications Prior to Admission medications   Medication Sig Start Date End Date Taking? Authorizing Provider  doxycycline (VIBRAMYCIN) 100 MG capsule Take 1 capsule (100 mg total) by mouth 2 (two) times daily for 7 days. 03/03/22 03/10/22 Yes Darrick Grinder, PA-C  ondansetron (ZOFRAN) 4 MG tablet Take 1 tablet (4 mg total) by mouth every 8 (eight) hours as needed for nausea or vomiting. 03/03/22  Yes Darrick Grinder, PA-C  acetaminophen (TYLENOL) 500 MG tablet Take 1,000 mg by mouth every 6 (six) hours as needed for mild pain.    [provider]  alum & mag hydroxide-simeth (MAALOX MAX) 400-400-40 MG/5ML suspension Take 10 mLs by mouth every 6 (six) hours as needed for indigestion. 07/08/20   Long, Arlyss Repress, MD  pantoprazole (PROTONIX) 40 MG tablet Take 1 tablet (40 mg total) by mouth daily. 07/08/20   Long, Arlyss Repress, MD      Allergies    Patient has no known allergies.    Review of Systems   Review of Systems  Constitutional:  Negative for fever.  Respiratory:  Negative for shortness of breath.   Gastrointestinal:  Positive for abdominal pain and  nausea. Negative for constipation, diarrhea and vomiting.  Genitourinary:  Positive for dysuria, urgency and vaginal discharge. Negative for flank pain.    Physical Exam Updated Vital Signs BP 110/74   Pulse 70   Temp 98.4 F (36.9 C) (Oral)   Resp 18   Ht 5\' 2"  (1.575 m)   Wt 61.2 kg   LMP 02/26/2022 (Approximate)   SpO2 100%   BMI 24.69 kg/m  Physical Exam Vitals and nursing note reviewed.  Constitutional:      General: She is not in acute distress.    Appearance: She is normal weight.  HENT:     Head: Normocephalic and atraumatic.     Mouth/Throat:     Mouth: Mucous membranes are moist.  Eyes:     Conjunctiva/sclera: Conjunctivae normal.  Cardiovascular:     Rate and Rhythm: Normal rate and regular rhythm.     Pulses: Normal pulses.     Heart sounds: Normal heart sounds.  Pulmonary:     Effort: Pulmonary effort is normal.     Breath sounds: Normal breath sounds.  Abdominal:     Palpations: Abdomen is soft.     Tenderness: There is no abdominal tenderness.  Genitourinary:    Comments: Deferred Musculoskeletal:        General: Normal range of motion.     Cervical back: Normal range of motion and neck supple.  Skin:    General: Skin is warm and  dry.     Capillary Refill: Capillary refill takes less than 2 seconds.  Neurological:     Mental Status: She is alert.     ED Results / Procedures / Treatments   Labs (all labs ordered are listed, but only abnormal results are displayed) Labs Reviewed  URINALYSIS, ROUTINE W REFLEX MICROSCOPIC  PREGNANCY, URINE  GC/CHLAMYDIA PROBE AMP ([place]) NOT AT [facility]    EKG None  Radiology No results found.  Procedures Procedures    Medications Ordered in ED Medications  ondansetron (ZOFRAN-ODT) disintegrating tablet 8 mg (8 mg Oral Given 03/03/22 1707)  cefTRIAXone (ROCEPHIN) injection 500 mg (500 mg Intramuscular Given 03/03/22 1708)  lidocaine (PF) (XYLOCAINE) 1 % injection (2.1 mLs  Given 03/03/22 1708)     ED Course/ Medical Decision Making/ A&P                           Medical Decision Making Amount and/or Complexity of Data Reviewed Labs: ordered.  Risk Prescription drug management.   The patient presented with a chief complaint of bladder spasms with dysuria.  Differential includes cystitis, pyelonephritis, nephrolithiasis, and others  I reviewed the patient's past medical history including chart review showing emergency department visit for pyelonephritis in the past.  I ordered and interpreted labs.  Negative pregnancy urine test.  Normal urinalysis.  GC chlamydia probe pending  I ordered the patient Rocephin for possible STI, and Zofran for nausea.  The patient had improved upon reassessment  The patient has no CVA tenderness, no hematuria.  This does not seem like pyelonephritis or nephrolithiasis.  The urinalysis showed no signs of infection or hematuria.  Very low clinical suspicion of a developing cystitis at this time.  The patient does endorse a small amount of vaginal discharge.  She states there may be a small possibility of STI exposure.  Gonorrhea chlamydia testing is pending.  This could be contributing to some of her symptoms.  I ordered the patient Rocephin for a possible STI and Zofran for nausea.  Upon reassessment the patient's nausea had improved  At this time the cause of her bladder spasms and dysuria is unclear.  I do not believe it is a urinary tract infection, pyelonephritis, or nephrolithiasis at this time.  She may have a developing STI.  Prescription provided to cover for developing STI.  She would not finish the prescription if her testing returns is negative.  I have provided contact information for urology so the patient may schedule a follow-up appointment.  She has a history of frequent UTIs and I believe follow-up is warranted.  Discharge home         Final Clinical Impression(s) / ED Diagnoses Final diagnoses:  Bladder spasm    Rx / DC  Orders ED Discharge Orders          Ordered    doxycycline (VIBRAMYCIN) 100 MG capsule  2 times daily        03/03/22 1740    ondansetron (ZOFRAN) 4 MG tablet  Every 8 hours PRN        03/03/22 1740              Darrick Grinder, PA-C 03/03/22 1745    Sloan Leiter, DO 03/05/22 2354

## 2022-03-03 NOTE — ED Triage Notes (Signed)
Patient states she knows she has a UTI, reports urgency, muscle spasms, and right flank pain. Patient also states she has had some abnormal discharge.

## 2022-03-03 NOTE — Discharge Instructions (Signed)
You were seen today for bladder spasm and possible exposure to an STI.  Your urine sample was benign.  Your gonorrhea chlamydia testing is pending.  I have treated you with a shot of Rocephin and ordered a prescription for doxycycline.  Please check MyChart for results.  If your testing is negative you do not need to complete the doxycycline prescription.  A Zofran prescription was provided for nausea as needed.  I have provided contact information for urology.  Please contact them to schedule a follow-up appointment for your frequent urinary tract infections and bladder spasms.

## 2022-03-05 LAB — GC/CHLAMYDIA PROBE AMP (~~LOC~~) NOT AT ARMC
Chlamydia: NEGATIVE
Comment: NEGATIVE
Comment: NORMAL
Neisseria Gonorrhea: NEGATIVE

## 2022-06-18 ENCOUNTER — Encounter (HOSPITAL_BASED_OUTPATIENT_CLINIC_OR_DEPARTMENT_OTHER): Payer: Self-pay | Admitting: Pediatrics

## 2022-06-18 ENCOUNTER — Emergency Department (HOSPITAL_BASED_OUTPATIENT_CLINIC_OR_DEPARTMENT_OTHER)
Admission: EM | Admit: 2022-06-18 | Discharge: 2022-06-18 | Disposition: A | Discharge status: Home / Self Care | Payer: Self-pay | Attending: Emergency Medicine | Admitting: Emergency Medicine

## 2022-06-18 ENCOUNTER — Emergency Department (HOSPITAL_BASED_OUTPATIENT_CLINIC_OR_DEPARTMENT_OTHER): Payer: Self-pay

## 2022-06-18 ENCOUNTER — Other Ambulatory Visit: Payer: Self-pay

## 2022-06-18 DIAGNOSIS — R197 Diarrhea, unspecified: Secondary | ICD-10-CM | POA: Insufficient documentation

## 2022-06-18 DIAGNOSIS — R112 Nausea with vomiting, unspecified: Secondary | ICD-10-CM | POA: Insufficient documentation

## 2022-06-18 DIAGNOSIS — R1084 Generalized abdominal pain: Secondary | ICD-10-CM | POA: Insufficient documentation

## 2022-06-18 LAB — COMPREHENSIVE METABOLIC PANEL
ALT: 25 U/L (ref 0–44)
AST: 27 U/L (ref 15–41)
Albumin: 4.5 g/dL (ref 3.5–5.0)
Alkaline Phosphatase: 44 U/L (ref 38–126)
Anion gap: 8 (ref 5–15)
BUN: 19 mg/dL (ref 6–20)
CO2: 26 mmol/L (ref 22–32)
Calcium: 8.9 mg/dL (ref 8.9–10.3)
Chloride: 105 mmol/L (ref 98–111)
Creatinine, Ser: 0.62 mg/dL (ref 0.44–1.00)
GFR, Estimated: >60 mL/min (ref >60)
Glucose, Bld: 109 mg/dL — ABNORMAL HIGH (ref 70–99)
Potassium: 3.6 mmol/L (ref 3.5–5.1)
Sodium: 139 mmol/L (ref 135–145)
Total Bilirubin: 0.9 mg/dL (ref 0.3–1.2)
Total Protein: 8.1 g/dL (ref 6.5–8.1)

## 2022-06-18 LAB — URINALYSIS, ROUTINE W REFLEX MICROSCOPIC
Bilirubin Urine: NEGATIVE
Glucose, UA: NEGATIVE mg/dL
Hgb urine dipstick: NEGATIVE
Ketones, ur: NEGATIVE mg/dL
Leukocytes,Ua: NEGATIVE
Nitrite: NEGATIVE
Protein, ur: 30 mg/dL — AB
Specific Gravity, Urine: 1.02 (ref 1.005–1.030)
pH: 5.5 (ref 5.0–8.0)

## 2022-06-18 LAB — CBC
HCT: 36.1 % (ref 36.0–46.0)
Hemoglobin: 12.4 g/dL (ref 12.0–15.0)
MCH: 28.4 pg (ref 26.0–34.0)
MCHC: 34.3 g/dL (ref 30.0–36.0)
MCV: 82.6 fL (ref 80.0–100.0)
Platelets: 245 10*3/uL (ref 150–400)
RBC: 4.37 MIL/uL (ref 3.87–5.11)
RDW: 12.7 % (ref 11.5–15.5)
WBC: 6.3 10*3/uL (ref 4.0–10.5)
nRBC: 0 % (ref 0.0–0.2)

## 2022-06-18 LAB — URINALYSIS, MICROSCOPIC (REFLEX)

## 2022-06-18 LAB — PREGNANCY, URINE: Preg Test, Ur: NEGATIVE

## 2022-06-18 LAB — LIPASE, BLOOD: Lipase: 30 U/L (ref 11–51)

## 2022-06-18 MED ORDER — SODIUM CHLORIDE 0.9 % IV BOLUS
1000.0000 mL | Freq: Once | INTRAVENOUS | Status: AC
  Administered 2022-06-18: 1000 mL via INTRAVENOUS

## 2022-06-18 MED ORDER — ONDANSETRON 4 MG PO TBDP: 4.0000 mg | ORAL_TABLET | Freq: Three times a day (TID) | ORAL | 0 refills | Status: DC | PRN

## 2022-06-18 MED ORDER — IOHEXOL 300 MG/ML  SOLN
100.0000 mL | Freq: Once | INTRAMUSCULAR | Status: AC | PRN
  Administered 2022-06-18: 100 mL via INTRAVENOUS

## 2022-06-18 MED ORDER — ONDANSETRON HCL 4 MG/2ML IJ SOLN
4.0000 mg | Freq: Once | INTRAMUSCULAR | Status: AC
  Administered 2022-06-18: 4 mg via INTRAVENOUS
  Filled 2022-06-18: qty 2

## 2022-06-18 MED ORDER — MORPHINE SULFATE (PF) 4 MG/ML IV SOLN
4.0000 mg | Freq: Once | INTRAVENOUS | Status: AC
  Administered 2022-06-18: 4 mg via INTRAVENOUS
  Filled 2022-06-18: qty 1

## 2022-06-18 NOTE — ED Triage Notes (Signed)
C/o sudden onset of abdominal pain, diarrhea, vomiting and weakness started early this morning.

## 2022-06-18 NOTE — ED Provider Notes (Signed)
[facility] EMERGENCY DEPARTMENT Provider Note   CSN: [id number] Arrival date & time: 06/18/22  1229     History  Chief Complaint  Patient presents with   Abdominal Pain    Janice Graham is a 36 y.o. female.  With a history of irritable bowel syndrome presents ED for evaluation of upper abdominal pain, nausea, vomiting and diarrhea.  Patient states she and her son both had watermelon last night and both woke up vomiting this morning.  Patient has not attempted to eat or drink anything since symptoms occurred this morning.  Reports chronic loose stools secondary to her IBS, however they are looser today than they usually are.  Denies melena or hematochezia.  Upper abdominal pain does radiate to the back on bilateral sides.  Denies fevers, chills, dysuria, frequency, urgency, vaginal discharge, odor, or pain, chest pain, shortness of breath.  Patient did state that she has just finished her 7-day course of clindamycin for a tooth infection.   Abdominal Pain Associated symptoms: diarrhea, nausea and vomiting        Home Medications Prior to Admission medications   Medication Sig Start Date End Date Taking? Authorizing Provider  acetaminophen (TYLENOL) 500 MG tablet Take 1,000 mg by mouth every 6 (six) hours as needed for mild pain.    [provider]  alum & mag hydroxide-simeth (MAALOX MAX) 400-400-40 MG/5ML suspension Take 10 mLs by mouth every 6 (six) hours as needed for indigestion. 07/08/20   Long, Arlyss Repress, MD  ondansetron (ZOFRAN) 4 MG tablet Take 1 tablet (4 mg total) by mouth every 8 (eight) hours as needed for nausea or vomiting. 03/03/22   Darrick Grinder, PA-C  pantoprazole (PROTONIX) 40 MG tablet Take 1 tablet (40 mg total) by mouth daily. 07/08/20   Long, Arlyss Repress, MD      Allergies    Patient has no known allergies.    Review of Systems   Review of Systems  Gastrointestinal:  Positive for abdominal pain, diarrhea, nausea and vomiting.    Physical  Exam Updated Vital Signs BP 110/72 (BP Location: Left Arm)   Pulse 94   Temp 98.1 F (36.7 C) (Oral)   Resp 18   Ht 5\' 2"  (1.575 m)   Wt 65.8 kg   LMP 06/01/2022   SpO2 100%   BMI 26.52 kg/m  Physical Exam Vitals and nursing note reviewed.  Constitutional:      General: She is not in acute distress.    Appearance: Normal appearance. She is well-developed and normal weight. She is not ill-appearing, toxic-appearing or diaphoretic.  HENT:     Head: Normocephalic and atraumatic.  Cardiovascular:     Rate and Rhythm: Normal rate and regular rhythm.     Heart sounds: Normal heart sounds.  Pulmonary:     Effort: Pulmonary effort is normal. No respiratory distress.     Breath sounds: Normal breath sounds. No stridor. No wheezing, rhonchi or rales.  Abdominal:     General: Abdomen is flat. Bowel sounds are normal.     Palpations: Abdomen is soft.     Tenderness: There is generalized abdominal tenderness. There is right CVA tenderness, left CVA tenderness and guarding. There is no rebound. Negative signs include Murphy's sign, Rovsing's sign, McBurney's sign, psoas sign and obturator sign.  Musculoskeletal:        General: Normal range of motion.     Cervical back: Neck supple.  Skin:    General: Skin is warm and dry.  Capillary Refill: Capillary refill takes less than 2 seconds.  Neurological:     General: No focal deficit present.     Mental Status: She is alert and oriented to person, place, and time.  Psychiatric:        Mood and Affect: Mood normal.        Behavior: Behavior normal.     ED Results / Procedures / Treatments   Labs (all labs ordered are listed, but only abnormal results are displayed) Labs Reviewed  CBC  LIPASE, BLOOD  COMPREHENSIVE METABOLIC PANEL  URINALYSIS, ROUTINE W REFLEX MICROSCOPIC  PREGNANCY, URINE    EKG None  Radiology No results found.  Procedures Procedures    Medications Ordered in ED Medications  sodium chloride 0.9 %  bolus 1,000 mL (1,000 mLs Intravenous New Bag/Given 06/18/22 1324)  ondansetron (ZOFRAN) injection 4 mg (4 mg Intravenous Given 06/18/22 1325)  morphine (PF) 4 MG/ML injection 4 mg (4 mg Intravenous Given 06/18/22 1326)    ED Course/ Medical Decision Making/ A&P Clinical Course as of 06/18/22 1601  Mon Jun 18, 2022  1531 CT Abdomen Pelvis W Contrast I personally reviewed the image.  No acute intra-abdominal abnormalities [AS]  1549 Symptoms are improved.  Will attempt p.o. challenge [AS]    Clinical Course User Index [AS] Tayana Shankle, Edsel Petrin, PA-C                           Medical Decision Making Amount and/or Complexity of Data Reviewed Labs: ordered. Radiology: ordered. Decision-making details documented in ED Course.  Risk Prescription drug management.  This patient presents to the ED for concern of abdominal pain, nausea, vomiting, diarrhea, this involves an extensive number of treatment options, and is a complaint that carries with it a high risk of complications and morbidity.  The differential diagnosis for generalized abdominal pain includes, but is not limited to AAA, gastroenteritis, appendicitis, Bowel obstruction, Bowel perforation. Gastroparesis, DKA, Hernia, Inflammatory bowel disease, mesenteric ischemia, pancreatitis, peritonitis SBP, volvulus.    Co morbidities that complicate the patient evaluation   IBS  My initial workup includes abdominal pain work-up, Zofran, fluids, pain control  Additional history obtained from: Nursing notes from this visit.  I ordered, reviewed and interpreted labs which include: CBC, urine pregnancy, urinalysis, CMP, lipase   I ordered imaging studies including CT abdomen pelvis I independently visualized and interpreted imaging which showed no acute intra-abdominal abnormalities I agree with the radiologist interpretation  Afebrile, hemodynamically stable.  Patient is a 36 year old female presenting to the ED for evaluation of  diffuse abdominal pain with cramping worse in the upper quadrants.  Physical exam remarkable for bilateral CVA tenderness as well as generalized tenderness to palpation.  Patient does have significant tenderness in the right lower quadrant pain which is the reason for ordering CT abdomen.  Lab work-up unremarkable.  CT abdomen pelvis negative.  Patient was treated with IV fluids, Zofran and morphine and reported resolution of her symptoms.  P.o. challenge was successful.  Patient presented prescription for p.o. Zofran.  I have low suspicion for acute abdomen.  Patient likely has gastroenteritis.  Encouraged patient to eat and drink a normal diet and push plenty of fluids.  Encourage patient to follow-up with her primary care provider.  Stable at discharge.  At this time there does not appear to be any evidence of an acute emergency medical condition and the patient appears stable for discharge with appropriate outpatient follow up. Diagnosis  was discussed with patient who verbalizes understanding of care plan and is agreeable to discharge. I have discussed return precautions with patient who verbalizes understanding. Patient encouraged to follow-up with their PCP within 1 week. All questions answered.  Patient's case discussed with Dr. Adela Lank who agrees with plan to discharge with follow-up.   Note: Portions of this report may have been transcribed using voice recognition software. Every effort was made to ensure accuracy; however, inadvertent computerized transcription errors may still be present.          Final Clinical Impression(s) / ED Diagnoses Final diagnoses:  None    Rx / DC Orders ED Discharge Orders     None         Michelle Piper, PA-C 06/18/22 1657    Melene Plan, DO 06/19/22 [phone]

## 2022-06-18 NOTE — Discharge Instructions (Signed)
You have been seen today for your complaint of abdominal pain, nausea, vomiting and diarrhea. Your lab work was reassuring and showed no abnormalities. Your imaging was reassuring and showed no abnormalities. Your discharge medications include Zofran.  This is a nausea medication.  You should take it as needed in order to eat and drink a normal diet. Home care instructions are as follows:  Slowly progress to a normal diet over the next 24 hours. Follow up with: Your primary care provider in the next 7 days Please seek immediate medical care if you develop any of the following symptoms: You have pain in your chest, neck, arm, or jaw. You feel extremely weak or you faint. You have persistent vomiting. You have vomit that is bright red or looks like black coffee grounds. You have bloody or black stools (feces) or stools that look like tar. You have a severe headache, a stiff neck, or both. You have severe pain, cramping, or bloating in your abdomen. You have difficulty breathing, or you are breathing very quickly. Your heart is beating very quickly. Your skin feels cold and clammy. You feel confused. You have signs of dehydration, such as: Dark urine, very little urine, or no urine. Cracked lips. Dry mouth. Sunken eyes. Sleepiness. Weakness. At this time there does not appear to be the presence of an emergent medical condition, however there is always the potential for conditions to change. Please read and follow the below instructions.  Do not take your medicine if  develop an itchy rash, swelling in your mouth or lips, or difficulty breathing; call 911 and seek immediate emergency medical attention if this occurs.  You may review your lab tests and imaging results in their entirety on your MyChart account.  Please discuss all results of fully with your primary care provider and other specialist at your follow-up visit.  Note: Portions of this text may have been transcribed using voice  recognition software. Every effort was made to ensure accuracy; however, inadvertent computerized transcription errors may still be present.

## 2022-06-18 NOTE — ED Notes (Signed)
Provider at bedside

## 2023-04-22 ENCOUNTER — Other Ambulatory Visit (HOSPITAL_BASED_OUTPATIENT_CLINIC_OR_DEPARTMENT_OTHER): Payer: Self-pay

## 2023-04-22 MED ORDER — NITROFURANTOIN MONOHYD MACRO 100 MG PO CAPS
100.0000 mg | ORAL_CAPSULE | Freq: Two times a day (BID) | ORAL | 0 refills | Status: AC
  Filled 2023-04-22: qty 10, 5d supply, fill #0

## 2024-07-17 ENCOUNTER — Emergency Department (HOSPITAL_BASED_OUTPATIENT_CLINIC_OR_DEPARTMENT_OTHER): Payer: Self-pay

## 2024-07-17 ENCOUNTER — Encounter (HOSPITAL_BASED_OUTPATIENT_CLINIC_OR_DEPARTMENT_OTHER): Payer: Self-pay

## 2024-07-17 ENCOUNTER — Other Ambulatory Visit (HOSPITAL_BASED_OUTPATIENT_CLINIC_OR_DEPARTMENT_OTHER): Payer: Self-pay

## 2024-07-17 ENCOUNTER — Other Ambulatory Visit: Payer: Self-pay

## 2024-07-17 ENCOUNTER — Emergency Department (HOSPITAL_BASED_OUTPATIENT_CLINIC_OR_DEPARTMENT_OTHER)
Admission: EM | Admit: 2024-07-17 | Discharge: 2024-07-17 | Disposition: A | Discharge status: Home / Self Care | Payer: Self-pay | Attending: Emergency Medicine | Admitting: Emergency Medicine

## 2024-07-17 DIAGNOSIS — N12 Tubulo-interstitial nephritis, not specified as acute or chronic: Secondary | ICD-10-CM

## 2024-07-17 LAB — COMPREHENSIVE METABOLIC PANEL WITH GFR
ALT: 18 U/L (ref 0–44)
AST: 31 U/L (ref 15–41)
Albumin: 4.7 g/dL (ref 3.5–5.0)
Alkaline Phosphatase: 66 U/L (ref 38–126)
Anion gap: 15 (ref 5–15)
BUN: 9 mg/dL (ref 6–20)
CO2: 24 mmol/L (ref 22–32)
Calcium: 9.8 mg/dL (ref 8.9–10.3)
Chloride: 100 mmol/L (ref 98–111)
Creatinine, Ser: 0.82 mg/dL (ref 0.44–1.00)
GFR, Estimated: >60 mL/min (ref >60)
Glucose, Bld: 99 mg/dL (ref 70–99)
Potassium: 3.8 mmol/L (ref 3.5–5.1)
Sodium: 139 mmol/L (ref 135–145)
Total Bilirubin: 0.9 mg/dL (ref 0.0–1.2)
Total Protein: 8.3 g/dL — ABNORMAL HIGH (ref 6.5–8.1)

## 2024-07-17 LAB — CBC
HCT: 36.3 % (ref 36.0–46.0)
Hemoglobin: 12.5 g/dL (ref 12.0–15.0)
MCH: 28.9 pg (ref 26.0–34.0)
MCHC: 34.4 g/dL (ref 30.0–36.0)
MCV: 84 fL (ref 80.0–100.0)
Platelets: 246 K/uL (ref 150–400)
RBC: 4.32 MIL/uL (ref 3.87–5.11)
RDW: 13.2 % (ref 11.5–15.5)
WBC: 11 K/uL — ABNORMAL HIGH (ref 4.0–10.5)
nRBC: 0 % (ref 0.0–0.2)

## 2024-07-17 LAB — URINALYSIS, W/ REFLEX TO CULTURE (INFECTION SUSPECTED)
Bilirubin Urine: NEGATIVE
Glucose, UA: NEGATIVE mg/dL
Ketones, ur: >=80 mg/dL — AB
Nitrite: NEGATIVE
Protein, ur: 100 mg/dL — AB
Specific Gravity, Urine: 1.02 (ref 1.005–1.030)
WBC, UA: >50 WBC/hpf (ref 0–5)
pH: 5.5 (ref 5.0–8.0)

## 2024-07-17 LAB — RESP PANEL BY RT-PCR (RSV, FLU A&B, COVID)  RVPGX2
Influenza A by PCR: NEGATIVE
Influenza B by PCR: NEGATIVE
Resp Syncytial Virus by PCR: NEGATIVE
SARS Coronavirus 2 by RT PCR: NEGATIVE

## 2024-07-17 LAB — LIPASE, BLOOD: Lipase: 10 U/L — ABNORMAL LOW (ref 11–51)

## 2024-07-17 LAB — PREGNANCY, URINE: Preg Test, Ur: NEGATIVE

## 2024-07-17 MED ORDER — SODIUM CHLORIDE 0.9 % IV BOLUS
1000.0000 mL | Freq: Once | INTRAVENOUS | Status: AC
  Administered 2024-07-17: 1000 mL via INTRAVENOUS

## 2024-07-17 MED ORDER — IOHEXOL 300 MG/ML  SOLN
80.0000 mL | Freq: Once | INTRAMUSCULAR | Status: AC | PRN
  Administered 2024-07-17: 80 mL via INTRAVENOUS

## 2024-07-17 MED ORDER — MORPHINE SULFATE (PF) 4 MG/ML IV SOLN
4.0000 mg | Freq: Once | INTRAVENOUS | Status: AC
  Administered 2024-07-17: 4 mg via INTRAVENOUS
  Filled 2024-07-17: qty 1

## 2024-07-17 MED ORDER — HYDROCODONE-ACETAMINOPHEN 5-325 MG PO TABS
1.0000 | ORAL_TABLET | ORAL | 0 refills | Status: DC | PRN
  Filled 2024-07-17: qty 6, 1d supply, fill #0

## 2024-07-17 MED ORDER — SODIUM CHLORIDE 0.9 % IV SOLN
2.0000 g | Freq: Once | INTRAVENOUS | Status: AC
  Administered 2024-07-17: 2 g via INTRAVENOUS
  Filled 2024-07-17: qty 20

## 2024-07-17 MED ORDER — OXYCODONE-ACETAMINOPHEN 5-325 MG PO TABS: 1.0000 | ORAL_TABLET | Freq: Once | ORAL | Status: DC

## 2024-07-17 MED ORDER — ONDANSETRON HCL 4 MG/2ML IJ SOLN
4.0000 mg | Freq: Once | INTRAMUSCULAR | Status: AC
  Administered 2024-07-17: 4 mg via INTRAVENOUS
  Filled 2024-07-17: qty 2

## 2024-07-17 MED ORDER — OXYCODONE-ACETAMINOPHEN 5-325 MG PO TABS
1.0000 | ORAL_TABLET | Freq: Once | ORAL | Status: AC
  Administered 2024-07-17: 1 via ORAL
  Filled 2024-07-17: qty 1

## 2024-07-17 MED ORDER — CEFPODOXIME PROXETIL 200 MG PO TABS
200.0000 mg | ORAL_TABLET | Freq: Two times a day (BID) | ORAL | 0 refills | Status: AC
  Filled 2024-07-17: qty 28, 14d supply, fill #0

## 2024-07-17 MED ORDER — ONDANSETRON 4 MG PO TBDP: 4.0000 mg | ORAL_TABLET | Freq: Once | ORAL | Status: DC

## 2024-07-17 MED ORDER — KETOROLAC TROMETHAMINE 15 MG/ML IJ SOLN
15.0000 mg | Freq: Once | INTRAMUSCULAR | Status: AC
  Administered 2024-07-17: 15 mg via INTRAVENOUS
  Filled 2024-07-17: qty 1

## 2024-07-17 MED ORDER — ONDANSETRON 4 MG PO TBDP
4.0000 mg | ORAL_TABLET | Freq: Three times a day (TID) | ORAL | 0 refills | Status: AC | PRN
  Filled 2024-07-17: qty 12, 4d supply, fill #0

## 2024-07-17 NOTE — ED Provider Notes (Signed)
 [place] [facility] Provider Note   CSN: [id number] Arrival date & time: 07/17/24  9162     Patient presents with: Back Pain and Generalized Body Aches   Janice Graham is a 38 y.o. female.   38 year old female with history of IBS, goiter presents with complaint of back pain from mid back to lower back as well as lower abdominal pain, nausea, vomiting, chills, dysuria and urgency as well as cough, congestion, body aches.  States that she developed symptoms of urgency and dysuria with URI symptoms about a week ago.  Last night, called for a telehealth visit and was called in Macrobid  which she started last night.  Symptoms worsened with vomiting and chills throughout the night.  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  Prior abdominal surgeries include C-section x 2.  Also reports a bruise to her right medial lower leg and visitor at bedside reports frequent nosebleeds.  No other complaints or concerns today.       Prior to Admission medications  Medication Sig Start Date End Date Taking? Authorizing Provider  cefpodoxime  (VANTIN ) 200 MG tablet Take 1 tablet (200 mg total) by mouth 2 (two) times daily for 14 days. 07/17/24 07/31/24 Yes Beverley Leita LABOR, PA-C  HYDROcodone -acetaminophen  (NORCO/VICODIN) 5-325 MG tablet Take 1 tablet by mouth every 4 (four) hours as needed. 07/17/24  Yes Beverley Leita LABOR, PA-C  ondansetron  (ZOFRAN -ODT) 4 MG disintegrating tablet Take 1 tablet (4 mg total) by mouth every 8 (eight) hours as needed for nausea or vomiting. 07/17/24  Yes Beverley Leita LABOR, PA-C  acetaminophen  (TYLENOL ) 500 MG tablet Take 1,000 mg by mouth every 6 (six) hours as needed for mild pain.    [provider]  alum & mag hydroxide-simeth (MAALOX MAX) 400-400-40 MG/5ML suspension Take 10 mLs by mouth every 6 (six) hours as needed for indigestion. 07/08/20   Long, Fonda MATSU, MD  ondansetron  (ZOFRAN ) 4 MG tablet  Take 1 tablet (4 mg total) by mouth every 8 (eight) hours as needed for nausea or vomiting. 03/03/22   Logan Ubaldo NOVAK, PA-C  pantoprazole  (PROTONIX ) 40 MG tablet Take 1 tablet (40 mg total) by mouth daily. 07/08/20   Long, Joshua G, MD    Allergies: Patient has no known allergies.    Review of Systems Negative except as per HPI Updated Vital Signs BP 110/78   Pulse (!) 107   Temp 99.2 F (37.3 C) (Oral)   Resp 17   SpO2 100%   Physical Exam Vitals and nursing note reviewed.  Constitutional:      General: She is not in acute distress.    Appearance: She is well-developed. She is not diaphoretic.  HENT:     Head: Normocephalic and atraumatic.  Eyes:     Conjunctiva/sclera: Conjunctivae normal.  Cardiovascular:     Rate and Rhythm: Regular rhythm. Tachycardia present.     Pulses: Normal pulses.     Heart sounds: Normal heart sounds.  Pulmonary:     Effort: Pulmonary effort is normal.     Breath sounds: Normal breath sounds.  Abdominal:     Palpations: Abdomen is soft.     Tenderness: There is generalized abdominal tenderness.  Musculoskeletal:        General: Tenderness present.     Cervical back: Neck supple.       Back:     Right lower leg: No edema.  Left lower leg: No edema.  Lymphadenopathy:     Cervical: No cervical adenopathy.  Skin:    General: Skin is warm and dry.     Findings: No erythema or rash.  Neurological:     Mental Status: She is alert and oriented to person, place, and time.  Psychiatric:        Behavior: Behavior normal.     (all labs ordered are listed, but only abnormal results are displayed) Labs Reviewed  CBC - Abnormal; Notable for the following components:      Result Value   WBC 11.0 (*)    All other components within normal limits  URINALYSIS, W/ REFLEX TO CULTURE (INFECTION SUSPECTED) - Abnormal; Notable for the following components:   APPearance HAZY (*)    Hgb urine dipstick MODERATE (*)    Ketones, ur >=80 (*)     Protein, ur 100 (*)    Leukocytes,Ua MODERATE (*)    Bacteria, UA MANY (*)    All other components within normal limits  COMPREHENSIVE METABOLIC PANEL WITH GFR - Abnormal; Notable for the following components:   Total Protein 8.3 (*)    All other components within normal limits  LIPASE, BLOOD - Abnormal; Notable for the following components:   Lipase 10 (*)    All other components within normal limits  RESP PANEL BY RT-PCR (RSV, FLU A&B, COVID)  [id number]  PREGNANCY, URINE    EKG: None  Radiology: CT ABDOMEN PELVIS W CONTRAST Result Date: 07/17/2024 CLINICAL DATA:  Worsening abdominal pain. Recent urinary tract infection. EXAM: CT ABDOMEN AND PELVIS WITH CONTRAST TECHNIQUE: Multidetector CT imaging of the abdomen and pelvis was performed using the standard protocol following bolus administration of intravenous contrast. RADIATION DOSE REDUCTION: This exam was performed according to the departmental dose-optimization program which includes automated exposure control, adjustment of the mA and/or kV according to patient size and/or use of iterative reconstruction technique. CONTRAST:  80mL OMNIPAQUE  IOHEXOL  300 MG/ML  SOLN COMPARISON:  06/18/2022 FINDINGS: Lower Chest: No acute findings. Hepatobiliary: No suspicious hepatic masses identified. Gallbladder is unremarkable. No evidence of biliary ductal dilatation. Pancreas:  No mass or inflammatory changes. Spleen: Within normal limits in size and appearance. Adrenals/Urinary Tract: Multifocal ill-defined areas of decreased enhancement are seen in the left kidney, consistent with pyelonephritis. No evidence of renal abscess. No suspicious masses identified. No evidence of ureteral calculi or hydronephrosis. Unremarkable unopacified urinary bladder. Stomach/Bowel: No evidence of obstruction, inflammatory process or abnormal fluid collections. Vascular/Lymphatic: No pathologically enlarged lymph nodes. No acute vascular findings. Reproductive:  No mass or  other significant abnormality. Other:  None. Musculoskeletal:  No suspicious bone lesions identified. IMPRESSION: Multifocal ill-defined areas of decreased enhancement in left kidney, consistent with pyelonephritis. No evidence of renal abscess or hydronephrosis. Electronically Signed   By: Norleen DELENA Kil M.D.   On: 07/17/2024 12:25     Procedures   Medications Ordered in the ED  oxyCODONE -acetaminophen  (PERCOCET/ROXICET) 5-325 MG per tablet 1 tablet (has no administration in time range)  ondansetron  (ZOFRAN ) injection 4 mg (4 mg Intravenous Given 07/17/24 1000)  sodium chloride  0.9 % bolus 1,000 mL (0 mLs Intravenous Stopped 07/17/24 1131)  morphine  (PF) 4 MG/ML injection 4 mg (4 mg Intravenous Given 07/17/24 1002)  cefTRIAXone  (ROCEPHIN ) 2 g in sodium chloride  0.9 % 100 mL IVPB (2 g Intravenous New Bag/Given 07/17/24 1108)  iohexol  (OMNIPAQUE ) 300 MG/ML solution 80 mL (80 mLs Intravenous Contrast Given 07/17/24 1116)  ondansetron  (ZOFRAN ) injection 4 mg (4 mg Intravenous  Given 07/17/24 1157)  ketorolac  (TORADOL ) 15 MG/ML injection 15 mg (15 mg Intravenous Given 07/17/24 1200)                                    Medical Decision Making Amount and/or Complexity of Data Reviewed Labs: ordered. Radiology: ordered.  Risk Prescription drug management.   This patient presents to the ED for concern of back pain, UTI, abdominal pain with chills and vomiting, this involves an extensive number of treatment options, and is a complaint that carries with it a high risk of complications and morbidity.  The differential diagnosis includes but not limited to pyelonephritis, appendicitis, urinary tract infection, kidney stone   Co morbidities / Chronic conditions that complicate the patient evaluation  IBS, goiter   Additional history obtained:  Additional history obtained from EMR External records from outside source obtained and reviewed including prior labs and imaging on file   Lab  Tests:  I Ordered, and personally interpreted labs.  The pertinent results include: CBC with mild leukocytosis white count of 11.  CMP without significant findings.  Urinalysis is contaminated although present for hemoglobin, ketones, protein and leukocytes with many bacteria.  Lipase normal.   Imaging Studies ordered:  I ordered imaging studies including CT abdomen pelvis with contrast I independently visualized and interpreted imaging which showed left-sided pyelonephritis I agree with the radiologist interpretation   Problem List / ED Course / Critical interventions / Medication management  38 year old female presents with concern for viral URI symptoms as well as urinary symptoms onset 1 week ago.  Now with vomiting, chills, back pain with concern for kidney infection.  She is found have generalized back tenderness, generalized abdominal discomfort.  Workup reveals left-sided pyelonephritis.  She is provided with Rocephin  IV prior to discharge on oral antibiotics.  Discharged with prescription for Norco and Zofran .  Recommend close follow-up with PCP.  If does not have PCP, provided with information for [facility] as well as [facility].  Return precautions. I ordered medication including morphine , Toradol , IV fluids, Rocephin , Percocet, Zofran  Reevaluation of the patient after these medicines showed that the patient able to tolerate p.o. fluids I have reviewed the patients home medicines and have made adjustments as needed    Social Determinants of Health:  No PCP on file   Test / Admission - Considered:  Stable for discharge      Final diagnoses:  Pyelonephritis    ED Discharge Orders          Ordered    cefpodoxime  (VANTIN ) 200 MG tablet  2 times daily        07/17/24 1255    HYDROcodone -acetaminophen  (NORCO/VICODIN) 5-325 MG tablet  Every 4 hours PRN        07/17/24 1255    ondansetron  (ZOFRAN -ODT) 4 MG disintegrating tablet  Every 8  hours PRN        07/17/24 1257               Beverley Leita LABOR, PA-C 07/17/24 1306    Yolande Lamar BROCKS, MD 07/21/24 1057

## 2024-07-17 NOTE — ED Triage Notes (Addendum)
 Pt reports UTI symptoms x 1 week . Started on Macrobid  last night. Seen by telehealth. Reports lower back pain that radiates to left since last night. Also flu like symptoms with body aches and cough yesterday x 1 day Nauseated and vomiting in triage Nosebleeds/dizziness

## 2024-07-17 NOTE — Discharge Instructions (Addendum)
 Stop the Macrobid  that was prescribed last night.  Take Vantin as prescribed and complete the full course. Norco as needed as prescribed for pain.  Do not drive or operate machinery with take this medication.  This medication can cause constipation. Take Zofran  as needed prescribed for nausea and vomiting.  Return to the ER for worsening or concerning symptoms otherwise it is very important that you follow-up with your primary care provider in the next day or 2.

## 2024-07-21 ENCOUNTER — Other Ambulatory Visit: Payer: Self-pay

## 2024-07-21 ENCOUNTER — Encounter (HOSPITAL_BASED_OUTPATIENT_CLINIC_OR_DEPARTMENT_OTHER): Payer: Self-pay

## 2024-07-21 NOTE — ED Triage Notes (Signed)
 Pt arrives with c/o lower back pain that has been ongoing. Pt was diagnosed with pyelonephritis. Pt reports urinary symptoms are better, but she is still feeling unsteady and having back pain.

## 2024-07-22 ENCOUNTER — Emergency Department (HOSPITAL_BASED_OUTPATIENT_CLINIC_OR_DEPARTMENT_OTHER)
Admission: EM | Admit: 2024-07-22 | Discharge: 2024-07-22 | Disposition: A | Payer: Self-pay | Attending: Emergency Medicine | Admitting: Emergency Medicine

## 2024-07-22 ENCOUNTER — Emergency Department (HOSPITAL_BASED_OUTPATIENT_CLINIC_OR_DEPARTMENT_OTHER): Payer: Self-pay

## 2024-07-22 DIAGNOSIS — E876 Hypokalemia: Secondary | ICD-10-CM

## 2024-07-22 DIAGNOSIS — D649 Anemia, unspecified: Secondary | ICD-10-CM

## 2024-07-22 DIAGNOSIS — M545 Low back pain, unspecified: Secondary | ICD-10-CM

## 2024-07-22 LAB — COMPREHENSIVE METABOLIC PANEL WITH GFR
ALT: 21 U/L (ref 0–44)
AST: 25 U/L (ref 15–41)
Albumin: 4.1 g/dL (ref 3.5–5.0)
Alkaline Phosphatase: 66 U/L (ref 38–126)
Anion gap: 10 (ref 5–15)
BUN: 11 mg/dL (ref 6–20)
CO2: 34 mmol/L — ABNORMAL HIGH (ref 22–32)
Calcium: 9.5 mg/dL (ref 8.9–10.3)
Chloride: 94 mmol/L — ABNORMAL LOW (ref 98–111)
Creatinine, Ser: 0.59 mg/dL (ref 0.44–1.00)
GFR, Estimated: >60 mL/min (ref >60)
Glucose, Bld: 96 mg/dL (ref 70–99)
Potassium: 2.8 mmol/L — ABNORMAL LOW (ref 3.5–5.1)
Sodium: 138 mmol/L (ref 135–145)
Total Bilirubin: <0.2 mg/dL (ref 0.0–1.2)
Total Protein: 7.1 g/dL (ref 6.5–8.1)

## 2024-07-22 LAB — CBC WITH DIFFERENTIAL/PLATELET
Abs Immature Granulocytes: 0.01 K/uL (ref 0.00–0.07)
Basophils Absolute: 0 K/uL (ref 0.0–0.1)
Basophils Relative: 0 %
Eosinophils Absolute: 0.1 K/uL (ref 0.0–0.5)
Eosinophils Relative: 1 %
HCT: 31.4 % — ABNORMAL LOW (ref 36.0–46.0)
Hemoglobin: 10.9 g/dL — ABNORMAL LOW (ref 12.0–15.0)
Immature Granulocytes: 0 %
Lymphocytes Relative: 44 %
Lymphs Abs: 2 K/uL (ref 0.7–4.0)
MCH: 28.5 pg (ref 26.0–34.0)
MCHC: 34.7 g/dL (ref 30.0–36.0)
MCV: 82.2 fL (ref 80.0–100.0)
Monocytes Absolute: 0.6 K/uL (ref 0.1–1.0)
Monocytes Relative: 14 %
Neutro Abs: 1.9 K/uL (ref 1.7–7.7)
Neutrophils Relative %: 41 %
Platelets: 293 K/uL (ref 150–400)
RBC: 3.82 MIL/uL — ABNORMAL LOW (ref 3.87–5.11)
RDW: 13.4 % (ref 11.5–15.5)
Smear Review: NORMAL
WBC: 4.6 K/uL (ref 4.0–10.5)
nRBC: 0 % (ref 0.0–0.2)

## 2024-07-22 LAB — URINALYSIS, W/ REFLEX TO CULTURE (INFECTION SUSPECTED)
Bilirubin Urine: NEGATIVE
Glucose, UA: NEGATIVE mg/dL
Hgb urine dipstick: NEGATIVE
Ketones, ur: NEGATIVE mg/dL
Leukocytes,Ua: NEGATIVE
Nitrite: NEGATIVE
Protein, ur: NEGATIVE mg/dL
Specific Gravity, Urine: <=1.005 (ref 1.005–1.030)
pH: 6 (ref 5.0–8.0)

## 2024-07-22 LAB — HCG, SERUM, QUALITATIVE: Preg, Serum: NEGATIVE

## 2024-07-22 MED ORDER — FENTANYL CITRATE (PF) 50 MCG/ML IJ SOSY
50.0000 ug | PREFILLED_SYRINGE | Freq: Once | INTRAMUSCULAR | Status: AC
  Administered 2024-07-22: 02:00:00 50 ug via INTRAVENOUS
  Filled 2024-07-22: qty 1

## 2024-07-22 MED ORDER — POTASSIUM CHLORIDE CRYS ER 10 MEQ PO TBCR: 10.0000 meq | EXTENDED_RELEASE_TABLET | Freq: Every day | ORAL | 0 refills | Status: AC

## 2024-07-22 MED ORDER — POTASSIUM CHLORIDE CRYS ER 20 MEQ PO TBCR
40.0000 meq | EXTENDED_RELEASE_TABLET | Freq: Once | ORAL | Status: AC
  Administered 2024-07-22: 04:00:00 40 meq via ORAL
  Filled 2024-07-22: qty 2

## 2024-07-22 MED ORDER — IOHEXOL 300 MG/ML  SOLN
100.0000 mL | Freq: Once | INTRAMUSCULAR | Status: AC | PRN
  Administered 2024-07-22: 02:00:00 100 mL via INTRAVENOUS

## 2024-07-22 MED ORDER — HYDROCODONE-ACETAMINOPHEN 5-325 MG PO TABS: 1.0000 | ORAL_TABLET | Freq: Four times a day (QID) | ORAL | 0 refills | Status: AC | PRN

## 2024-07-22 MED ORDER — SODIUM CHLORIDE 0.9 % IV BOLUS
1000.0000 mL | Freq: Once | INTRAVENOUS | Status: AC
  Administered 2024-07-22: 01:00:00 1000 mL via INTRAVENOUS

## 2024-07-22 MED ORDER — ONDANSETRON HCL 4 MG/2ML IJ SOLN
4.0000 mg | Freq: Once | INTRAMUSCULAR | Status: AC
  Administered 2024-07-22: 02:00:00 4 mg via INTRAVENOUS
  Filled 2024-07-22: qty 2

## 2024-07-22 NOTE — ED Provider Notes (Signed)
 [place] [facility] Provider Note   CSN: [id number] Arrival date & time: 07/21/24  2202     Patient presents with: Back Pain   Janice Graham is a 38 y.o. female.   The history is provided by the patient and medical records.  Back Pain Janice Graham is a 38 y.o. female who presents to the Emergency Department complaining of feeling unwell.  She presents to the emergency department for evaluation of persistent malaise and back pain.  She was seen in the emergency department on Friday for low back pain bilaterally with associated vomiting and she was started on Vantin  for pyelonephritis.  She has been compliant with the medication since ED discharge.  She states that her fevers have been resolved for about 24 hours but she has persistent bilateral low back pain, left greater than right.  Pain at times radiates to the tops of her thighs bilaterally.  She is able to walk but has stiffness and she is slow to walk.  She is no longer vomiting.  No numbness, dysuria.  She does have some ongoing abdominal discomfort.  She has a history of recurrent UTIs, otherwise no significant medical history.        Prior to Admission medications  Medication Sig Start Date End Date Taking? Authorizing Provider  HYDROcodone -acetaminophen  (NORCO/VICODIN) 5-325 MG tablet Take 1 tablet by mouth every 6 (six) hours as needed. 07/22/24  Yes Griselda Norris, MD  potassium chloride  (KLOR-CON  M) 10 MEQ tablet Take 1 tablet (10 mEq total) by mouth daily. 07/22/24  Yes Griselda Norris, MD  acetaminophen  (TYLENOL ) 500 MG tablet Take 1,000 mg by mouth every 6 (six) hours as needed for mild pain.    [provider]  alum & mag hydroxide-simeth (MAALOX MAX) 400-400-40 MG/5ML suspension Take 10 mLs by mouth every 6 (six) hours as needed for indigestion. 07/08/20   Long, Joshua G, MD  cefpodoxime  (VANTIN ) 200 MG tablet Take 1 tablet (200 mg total) by mouth 2 (two) times daily for 14  days. 07/17/24 07/31/24  Beverley Leita LABOR, PA-C  ondansetron  (ZOFRAN ) 4 MG tablet Take 1 tablet (4 mg total) by mouth every 8 (eight) hours as needed for nausea or vomiting. 03/03/22   Logan Ubaldo NOVAK, PA-C  ondansetron  (ZOFRAN -ODT) 4 MG disintegrating tablet Take 1 tablet (4 mg total) by mouth every 8 (eight) hours as needed for nausea or vomiting. 07/17/24   Beverley Leita LABOR, PA-C  pantoprazole  (PROTONIX ) 40 MG tablet Take 1 tablet (40 mg total) by mouth daily. 07/08/20   Long, Joshua G, MD    Allergies: Patient has no known allergies.    Review of Systems  Musculoskeletal:  Positive for back pain.  All other systems reviewed and are negative.   Updated Vital Signs BP 111/73   Pulse 68   Temp 98.2 F (36.8 C) (Oral)   Resp 18   Ht 5' 2 (1.575 m)   Wt 65.8 kg   SpO2 100%   BMI 26.52 kg/m   Physical Exam Vitals and nursing note reviewed.  Constitutional:      Appearance: She is well-developed.  HENT:     Head: Normocephalic and atraumatic.  Cardiovascular:     Rate and Rhythm: Normal rate and regular rhythm.     Heart sounds: No murmur heard. Pulmonary:     Effort: Pulmonary effort is normal. No respiratory distress.     Breath sounds: Normal breath sounds.  Abdominal:     Palpations: Abdomen  is soft.     Tenderness: There is no guarding or rebound.     Comments: Moderate generalized abdominal tenderness  Musculoskeletal:        General: No tenderness.  Skin:    General: Skin is warm and dry.  Neurological:     Mental Status: She is alert and oriented to person, place, and time.     Comments: 5/5 strength in all four extremities.    Psychiatric:        Behavior: Behavior normal.     (all labs ordered are listed, but only abnormal results are displayed) Labs Reviewed  CBC WITH DIFFERENTIAL/PLATELET - Abnormal; Notable for the following components:      Result Value   RBC 3.82 (*)    Hemoglobin 10.9 (*)    HCT 31.4 (*)    All other components within normal  limits  COMPREHENSIVE METABOLIC PANEL WITH GFR - Abnormal; Notable for the following components:   Potassium 2.8 (*)    Chloride 94 (*)    CO2 34 (*)    All other components within normal limits  HCG, SERUM, QUALITATIVE  URINALYSIS, W/ REFLEX TO CULTURE (INFECTION SUSPECTED)    EKG: None  Radiology: CT ABDOMEN PELVIS W CONTRAST Result Date: 07/22/2024 EXAM: CT ABDOMEN AND PELVIS WITH CONTRAST 07/22/2024 02:20:56 AM TECHNIQUE: CT of the abdomen and pelvis was performed with the administration of 100 mL of iohexol  (OMNIPAQUE ) 300 MG/ML solution. Multiplanar reformatted images are provided for review. Automated exposure control, iterative reconstruction, and/or weight-based adjustment of the mA/kV was utilized to reduce the radiation dose to as low as reasonably achievable. COMPARISON: 07/17/2024 CLINICAL HISTORY: History of pyelonephritis with persistent abdominal pain. FINDINGS: LOWER CHEST: No acute abnormality. LIVER: The liver is unremarkable. GALLBLADDER AND BILE DUCTS: Gallbladder is unremarkable. No biliary ductal dilatation. SPLEEN: No acute abnormality. PANCREAS: No acute abnormality. ADRENAL GLANDS: No acute abnormality. KIDNEYS, URETERS AND BLADDER: Improved enhancement is noted in the left kidney, although some residual patchy decreased enhancement is noted, consistent with the known history of pyelonephritis. No obstructive changes are seen. No stones in the kidneys or ureters. No hydronephrosis. The bladder is within normal limits. GI AND BOWEL: The small bowel and stomach are within normal limits. The appendix is within normal limits. There is no bowel obstruction. PERITONEUM AND RETROPERITONEUM: No ascites. No free air. VASCULATURE: Aorta is normal in caliber. LYMPH NODES: No lymphadenopathy. REPRODUCTIVE ORGANS: The uterus is unremarkable. BONES AND SOFT TISSUES: No acute osseous abnormality. No focal soft tissue abnormality. IMPRESSION: 1. Improved enhancement in the left kidney with  some residual patchy decreased enhancement, compatible with pyelonephritis. No obstructive changes are seen. Electronically signed by: Oneil Devonshire MD 07/22/2024 02:30 AM EST RP Workstation: [id number]     Procedures   Medications Ordered in the ED  sodium chloride  0.9 % bolus 1,000 mL (0 mLs Intravenous Stopped 07/22/24 0310)  fentaNYL  (SUBLIMAZE ) injection 50 mcg (50 mcg Intravenous Given 07/22/24 0131)  ondansetron  (ZOFRAN ) injection 4 mg (4 mg Intravenous Given 07/22/24 0130)  iohexol  (OMNIPAQUE ) 300 MG/ML solution 100 mL (100 mLs Intravenous Contrast Given 07/22/24 0215)  potassium chloride  SA (KLOR-CON  M) CR tablet 40 mEq (40 mEq Oral Given 07/22/24 0345)                                    Medical Decision Making Amount and/or Complexity of Data Reviewed Labs: ordered. Radiology: ordered.  Risk Prescription drug  management.   Patient with recent pyelonephritis currently on antibiotics here for evaluation of persistent pain in her low back bilaterally as well as malaise.  Labs are significant for hypokalemia.  Hemoglobin has down trended but she is not having any reported bleeding.  Given her persistent symptoms a CT scan was obtained to rule out perinephric abscess or psoas abscess.  CT scan demonstrates resolving pyelonephritis.  She did not have a prior culture performed, we will check a culture today.  She does have hypokalemia on today's labs.  We will replace this orally and discharged home on oral potassium supplements.  Current picture is not consistent with discitis/osteomyelitis, acute epidural abscess.  Suspect she has ongoing symptoms secondary to her pyelonephritis that is continue to recover.  Feel she is stable for discharge home with outpatient follow-up and return precautions.     Final diagnoses:  Acute bilateral low back pain without sciatica  Hypokalemia  Anemia, unspecified type    ED Discharge Orders          Ordered    potassium chloride  (KLOR-CON  M)  10 MEQ tablet  Daily        07/22/24 0333    HYDROcodone -acetaminophen  (NORCO/VICODIN) 5-325 MG tablet  Every 6 hours PRN        07/22/24 0335               Griselda Norris, MD 07/22/24 [phone]
